# Patient Record
Sex: Male | Born: 1990 | Race: White | Hispanic: No | Marital: Single | Smoking: Never smoker
Health system: Southern US, Community
[De-identification: ages and names within clinical notes are randomized; demographics above are authoritative.]

## PROBLEM LIST (undated history)

## (undated) DIAGNOSIS — F32A Depression, unspecified: Secondary | ICD-10-CM

## (undated) DIAGNOSIS — F988 Other specified behavioral and emotional disorders with onset usually occurring in childhood and adolescence: Secondary | ICD-10-CM

## (undated) DIAGNOSIS — F329 Major depressive disorder, single episode, unspecified: Secondary | ICD-10-CM

## (undated) HISTORY — DX: Other specified behavioral and emotional disorders with onset usually occurring in childhood and adolescence: F98.8

## (undated) HISTORY — DX: Depression, unspecified: F32.A

---
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
## 2012-12-31 ENCOUNTER — Emergency Department (INDEPENDENT_AMBULATORY_CARE_PROVIDER_SITE_OTHER)
Admission: EM | Admit: 2012-12-31 | Discharge: 2012-12-31 | Disposition: A | Discharge status: Home / Self Care | Payer: BC Managed Care – PPO | Source: Home / Self Care | Attending: Emergency Medicine | Admitting: Emergency Medicine

## 2012-12-31 ENCOUNTER — Encounter (HOSPITAL_COMMUNITY): Payer: Self-pay | Admitting: *Deleted

## 2012-12-31 ENCOUNTER — Emergency Department (INDEPENDENT_AMBULATORY_CARE_PROVIDER_SITE_OTHER): Payer: BC Managed Care – PPO

## 2012-12-31 DIAGNOSIS — J208 Acute bronchitis due to other specified organisms: Secondary | ICD-10-CM

## 2012-12-31 DIAGNOSIS — J209 Acute bronchitis, unspecified: Secondary | ICD-10-CM

## 2012-12-31 MED ORDER — TRAMADOL HCL 50 MG PO TABS: 100.0000 mg | ORAL_TABLET | Freq: Three times a day (TID) | ORAL | Status: DC | PRN

## 2012-12-31 MED ORDER — IPRATROPIUM BROMIDE 0.02 % IN SOLN
0.5000 mg | Freq: Once | RESPIRATORY_TRACT | Status: AC
  Administered 2012-12-31: 0.5 mg via RESPIRATORY_TRACT

## 2012-12-31 MED ORDER — PREDNISONE 20 MG PO TABS: 20.0000 mg | ORAL_TABLET | Freq: Two times a day (BID) | ORAL | Status: DC

## 2012-12-31 MED ORDER — ALBUTEROL SULFATE HFA 108 (90 BASE) MCG/ACT IN AERS: 2.0000 | INHALATION_SPRAY | Freq: Four times a day (QID) | RESPIRATORY_TRACT | Status: DC

## 2012-12-31 MED ORDER — ALBUTEROL SULFATE (5 MG/ML) 0.5% IN NEBU
INHALATION_SOLUTION | RESPIRATORY_TRACT | Status: AC
  Filled 2012-12-31: qty 1

## 2012-12-31 MED ORDER — ALBUTEROL SULFATE (5 MG/ML) 0.5% IN NEBU
5.0000 mg | INHALATION_SOLUTION | Freq: Once | RESPIRATORY_TRACT | Status: AC
  Administered 2012-12-31: 5 mg via RESPIRATORY_TRACT

## 2012-12-31 MED ORDER — HYDROCOD POLST-CHLORPHEN POLST 10-8 MG/5ML PO LQCR: 5.0000 mL | Freq: Two times a day (BID) | ORAL | Status: DC | PRN

## 2012-12-31 NOTE — ED Provider Notes (Signed)
Chief Complaint:   Chief Complaint  Patient presents with  . URI    History of Present Illness:   Guthrie Lemme is a male the a history throat, chest pain and pressure, cough productive yellow-green sputum, chest tightness, wheezing, headache, nasal congestion, rhinorrhea, ear congestion, abdominal pain. The patient has a history of asthma. He states that this went away about 10 years ago and is not need any treatment since that. He denies any fever, chills, nausea, vomiting, or diarrhea. He has had no known sick exposures.  Review of Systems:  Other than noted above, the patient denies any of the following symptoms: Systemic:  No fevers, chills, sweats, weight loss or gain, fatigue, or tiredness. Eye:  No redness or discharge. ENT:  No ear pain, drainage, headache, nasal congestion, drainage, sinus pressure, difficulty swallowing, or sore throat. Neck:  No neck pain or swollen glands. Lungs:  No cough, sputum production, hemoptysis, wheezing, chest tightness, shortness of breath or chest pain. GI:  No abdominal pain, nausea, vomiting or diarrhea.  PMFSH:  Past medical history, family history, social history, meds, and allergies were reviewed.   Physical Exam:   Vital signs:  BP 129/55  Pulse 80  Temp(Src) 98.5 F (36.9 C) (Oral)  Resp 20  SpO2 99% General:  Alert and oriented.  In no distress.  Skin warm and dry. Eye:  No conjunctival injection or drainage. Lids were normal. ENT:  TMs and canals were normal, without erythema or inflammation.  Nasal mucosa was clear and uncongested, without drainage.  Mucous membranes were moist.  Pharynx was clear with no exudate or drainage.  There were no oral ulcerations or lesions. Neck:  Supple, no adenopathy, tenderness or mass. Lungs:  No respiratory distress.  He has bilateral expiratory wheezes, no rales or rhonchi.  Heart:  Regular rhythm, without gallops, murmers or rubs. Skin:  Clear, warm, and dry, without rash or lesions.  Labs:    Results for orders placed during the hospital encounter of 12/31/12  POCT RAPID STREP A ([facility] ONLY)      Result Value Range   Streptococcus, Group A Screen (Direct) NEGATIVE  NEGATIVE     Radiology:  Dg Chest 2 View  12/31/2012   CLINICAL DATA:  Sore throat and chest pressure.  EXAM: CHEST  2 VIEW  COMPARISON:  None.  FINDINGS: The heart size and mediastinal contours are within normal limits. Both lungs are clear. The visualized skeletal structures are unremarkable.  IMPRESSION: No active cardiopulmonary disease.   Electronically Signed   By: Richarda Overlie M.D.   On: 12/31/2012 20:22    Course in Urgent Care Center:   Given a DuoNeb breathing treatment. Thereafter he felt better. Lungs sounded about 50% improved with still some widely scattered expiratory wheezes.  Assessment:  The encounter diagnosis was Viral bronchitis.  No indication for antibiotics.  Plan:   1.  Meds:  The following meds were prescribed:   Discharge Medication List as of 12/31/2012  8:30 PM    START taking these medications   Details  albuterol (PROVENTIL HFA;VENTOLIN HFA) 108 (90 BASE) MCG/ACT inhaler Inhale 2 puffs into the lungs 4 (four) times daily., Starting 12/31/2012, Until Discontinued, Normal    chlorpheniramine-HYDROcodone (TUSSIONEX) 10-8 MG/5ML LQCR Take 5 mLs by mouth every 12 (twelve) hours as needed., Starting 12/31/2012, Until Discontinued, Normal    predniSONE (DELTASONE) 20 MG tablet Take 1 tablet (20 mg total) by mouth 2 (two) times daily., Starting 12/31/2012, Until Discontinued, Normal  traMADol (ULTRAM) 50 MG tablet Take 2 tablets (100 mg total) by mouth every 8 (eight) hours as needed for pain., Starting 12/31/2012, Until Discontinued, Normal        2.  Patient Education/Counseling:  The patient was given appropriate handouts, self care instructions, and instructed in symptomatic relief.  Was instructed to rest and get extra fluids.  3.  Follow up:  The patient was told to follow up  if no better in 3 to 4 days, if becoming worse in any way, and given some red flag symptoms such as fever or worsening chest discomfort which would prompt immediate return.  Follow up here as needed.      Reuben Likes, MD 12/31/12 [phone]

## 2012-12-31 NOTE — ED Notes (Signed)
Pt  Reports  Symptoms  Of  Cough  /  Congested    Stuffy  Nose  And  Headache  With  The  Symptoms  X    1  Day    At this  Time  He  Is     Sitting upright on  The  Exam table  Speaking in  Complete  Sentanc=nces  And  Is  In no  Severe  Distress

## 2013-01-03 LAB — CULTURE, GROUP A STREP

## 2013-01-04 NOTE — ED Notes (Signed)
Strep report negative for group A and negative for beta hemolytic strep; no further action required

## 2013-01-04 NOTE — ED Notes (Signed)
Lab review

## 2014-08-22 IMAGING — CR DG CHEST 2V
2 series · 2 of 2 positions shown · non-contrast
Comparison: None.

CLINICAL DATA: Sore throat and chest pressure.

EXAM:
CHEST  2 VIEW

[view not recorded (1 of 2)]
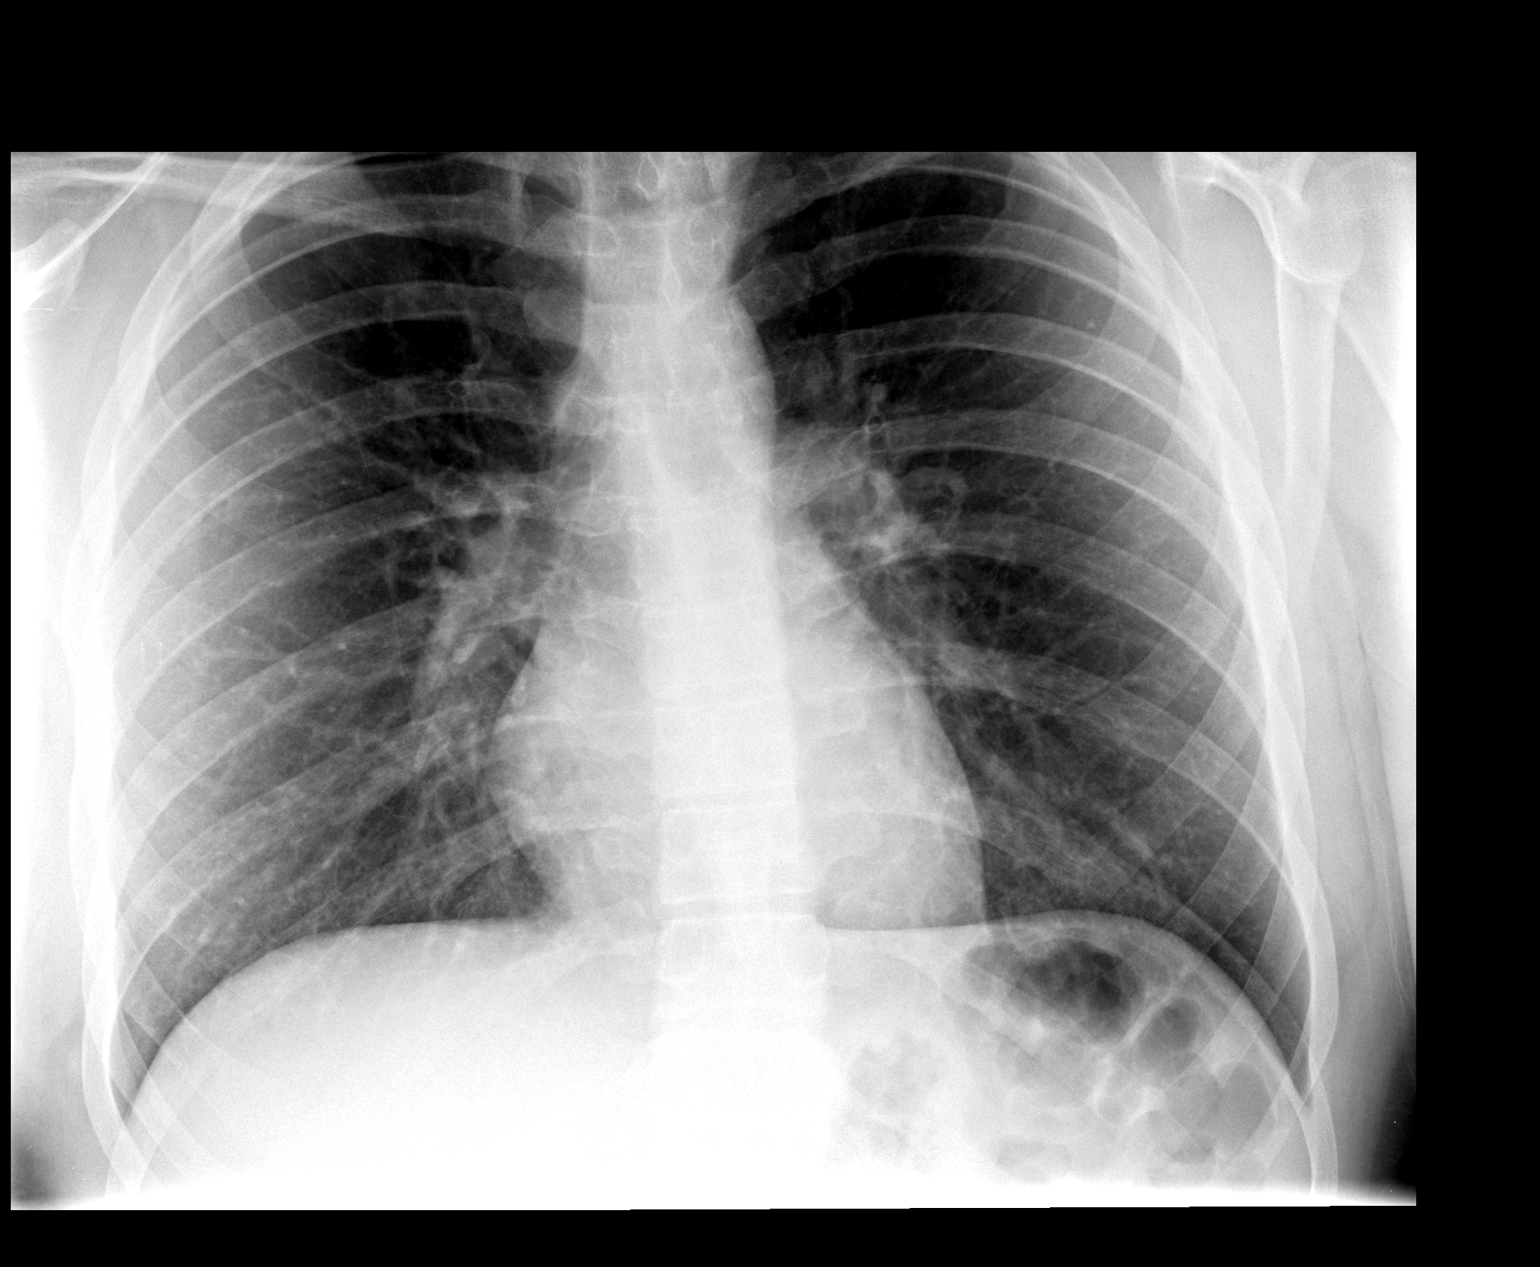

[view not recorded (2 of 2)]
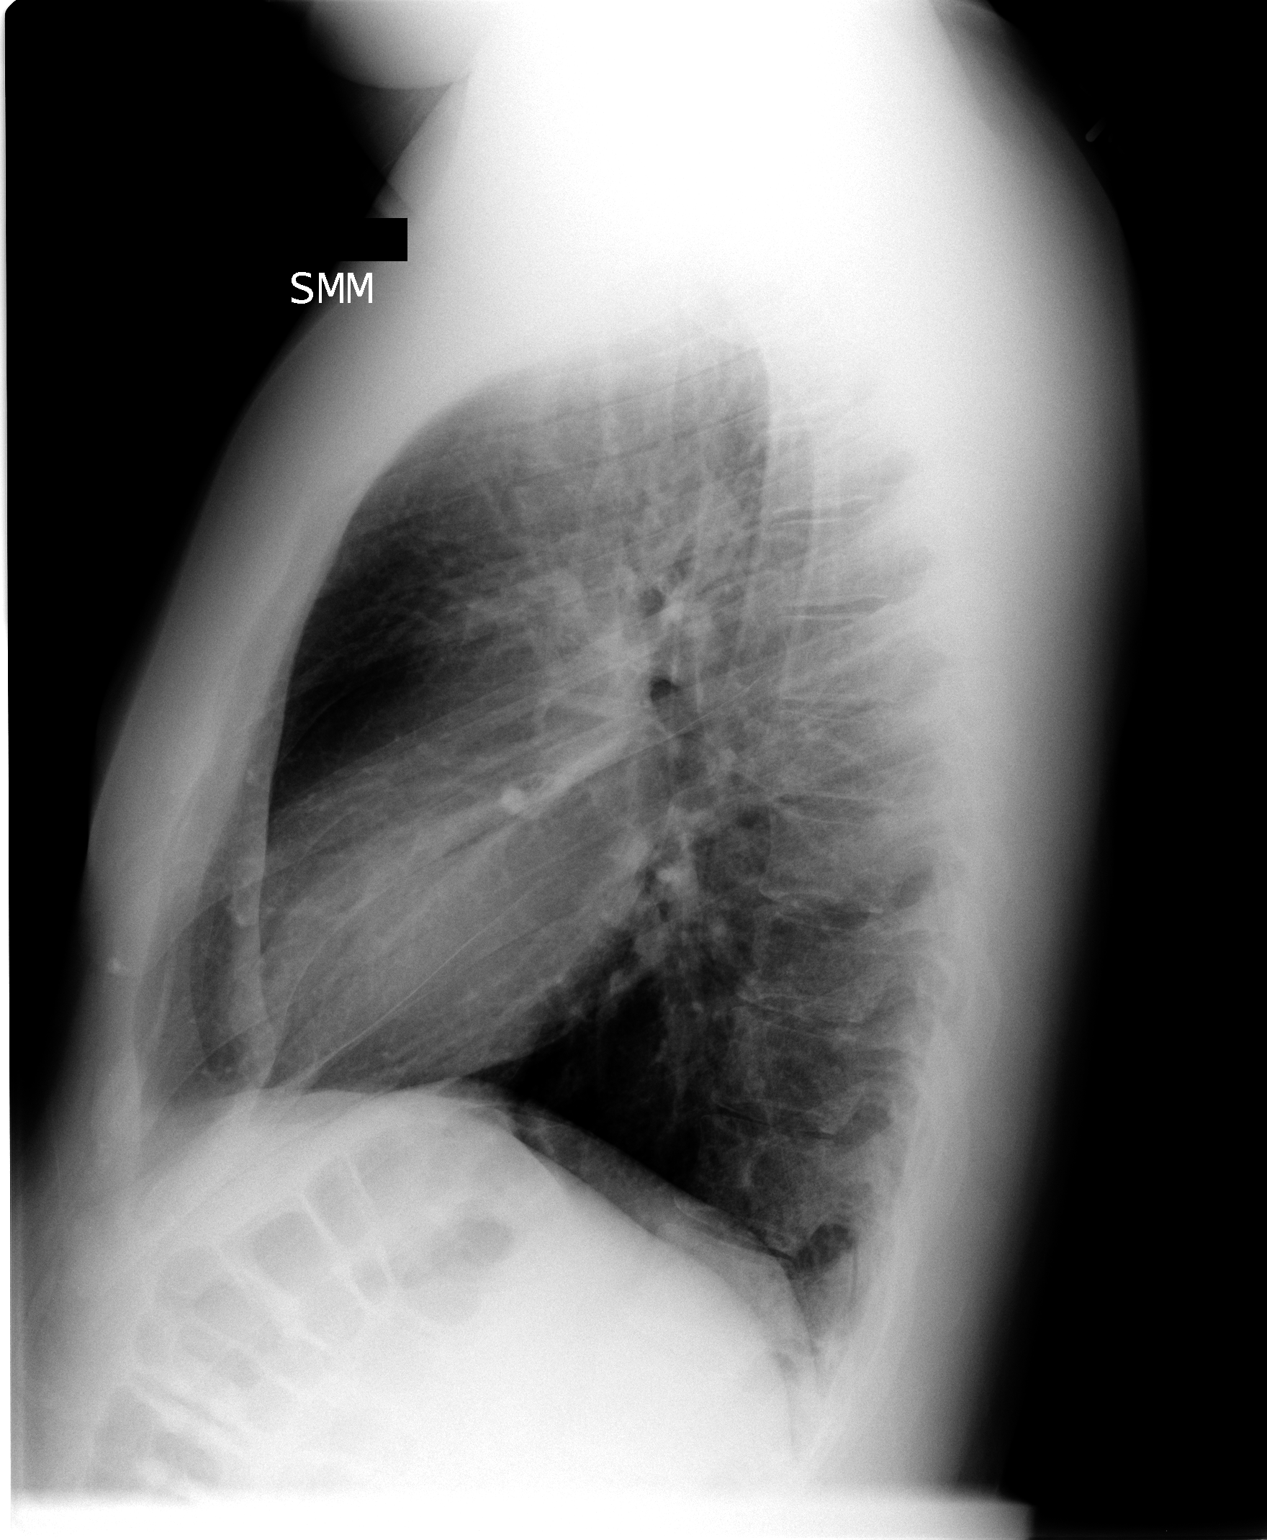

[2 of 2 positions shown; findings below may reference images not displayed]

FINDINGS: The heart size and mediastinal contours are within normal limits.
Both lungs are clear. The visualized skeletal structures are
unremarkable.
IMPRESSION: No active cardiopulmonary disease.

## 2019-03-26 ENCOUNTER — Other Ambulatory Visit: Payer: Self-pay

## 2019-03-26 DIAGNOSIS — Z20822 Contact with and (suspected) exposure to covid-19: Secondary | ICD-10-CM

## 2019-03-27 LAB — NOVEL CORONAVIRUS, NAA: SARS-CoV-2, NAA: NOT DETECTED

## 2019-04-08 ENCOUNTER — Other Ambulatory Visit: Payer: Self-pay

## 2019-04-12 ENCOUNTER — Other Ambulatory Visit: Payer: Self-pay

## 2019-04-12 ENCOUNTER — Ambulatory Visit (INDEPENDENT_AMBULATORY_CARE_PROVIDER_SITE_OTHER): Payer: Managed Care, Other (non HMO) | Admitting: Family Medicine

## 2019-04-12 ENCOUNTER — Encounter: Payer: Self-pay | Admitting: Family Medicine

## 2019-04-12 VITALS — BP 118/78 | HR 75 | Temp 98.0°F | Ht 73.0 in | Wt 217.0 lb

## 2019-04-12 DIAGNOSIS — F988 Other specified behavioral and emotional disorders with onset usually occurring in childhood and adolescence: Secondary | ICD-10-CM | POA: Diagnosis not present

## 2019-04-12 DIAGNOSIS — Z114 Encounter for screening for human immunodeficiency virus [HIV]: Secondary | ICD-10-CM | POA: Diagnosis not present

## 2019-04-12 DIAGNOSIS — Z0000 Encounter for general adult medical examination without abnormal findings: Secondary | ICD-10-CM | POA: Diagnosis not present

## 2019-04-12 LAB — CBC WITH DIFFERENTIAL/PLATELET
Basophils Absolute: 0 10*3/uL (ref 0.0–0.1)
Basophils Relative: 0.4 % (ref 0.0–3.0)
Eosinophils Absolute: 0.1 10*3/uL (ref 0.0–0.7)
Eosinophils Relative: 1.6 % (ref 0.0–5.0)
HCT: 49.3 % (ref 39.0–52.0)
Hemoglobin: 16.5 g/dL (ref 13.0–17.0)
Lymphocytes Relative: 30.9 % (ref 12.0–46.0)
Lymphs Abs: 2.3 10*3/uL (ref 0.7–4.0)
MCHC: 33.4 g/dL (ref 30.0–36.0)
MCV: 86.6 fl (ref 78.0–100.0)
Monocytes Absolute: 0.7 10*3/uL (ref 0.1–1.0)
Monocytes Relative: 9.6 % (ref 3.0–12.0)
Neutro Abs: 4.2 10*3/uL (ref 1.4–7.7)
Neutrophils Relative %: 57.5 % (ref 43.0–77.0)
Platelets: 234 10*3/uL (ref 150.0–400.0)
RBC: 5.69 Mil/uL (ref 4.22–5.81)
RDW: 12.4 % (ref 11.5–15.5)
WBC: 7.3 10*3/uL (ref 4.0–10.5)

## 2019-04-12 LAB — LIPID PANEL
Cholesterol: 214 mg/dL — ABNORMAL HIGH (ref 0–200)
HDL: 66 mg/dL (ref >39.00)
NonHDL: 147.78
Total CHOL/HDL Ratio: 3
Triglycerides: 231 mg/dL — ABNORMAL HIGH (ref 0.0–149.0)
VLDL: 46.2 mg/dL — ABNORMAL HIGH (ref 0.0–40.0)

## 2019-04-12 LAB — COMPREHENSIVE METABOLIC PANEL
ALT: 26 U/L (ref 0–53)
AST: 19 U/L (ref 0–37)
Albumin: 4.4 g/dL (ref 3.5–5.2)
Alkaline Phosphatase: 61 U/L (ref 39–117)
BUN: 19 mg/dL (ref 6–23)
CO2: 30 mEq/L (ref 19–32)
Calcium: 9.8 mg/dL (ref 8.4–10.5)
Chloride: 103 mEq/L (ref 96–112)
Creatinine, Ser: 1.01 mg/dL (ref 0.40–1.50)
GFR: 87.74 mL/min (ref >60.00)
Glucose, Bld: 99 mg/dL (ref 70–99)
Potassium: 4.3 mEq/L (ref 3.5–5.1)
Sodium: 138 mEq/L (ref 135–145)
Total Bilirubin: 0.4 mg/dL (ref 0.2–1.2)
Total Protein: 6.7 g/dL (ref 6.0–8.3)

## 2019-04-12 LAB — TSH: TSH: 1.79 u[IU]/mL (ref 0.35–4.50)

## 2019-04-12 LAB — LDL CHOLESTEROL, DIRECT: Direct LDL: 112 mg/dL

## 2019-04-12 NOTE — Patient Instructions (Signed)
For your ADD -see what you need for your job and let me know if you need a letter.  -see if you can get your records for when you were diagnosed.  -once we have work cleared, we can start you on immediate release adderall on an as needed basis. Will start at 10mg .  -follow you on this every 6 months unless you are needing frequently.   Routine labs today. Continue working out and [employer].   So nice to meet you and happy new year! DR. Rogers Blocker

## 2019-04-12 NOTE — Progress Notes (Signed)
Patient: Don Howell MRN: [id number] DOB: 1990/07/20 PCP: Patient, No Pcp Per     Subjective:  Chief Complaint  Patient presents with  . Establish Care  . Annual Exam    HPI: The patient is a 28 y.o. male who presents today for annual exam. He denies any changes to past medical history. There have been no recent hospitalizations. They are following a well balanced diet and exercise plan. Weight has been stable. He is sexually active with one partner.   No colon or breast cancer in first degree relative. Hx of obesity in his parents. Unsure of other history.   History of ADD: officially tested when he was 28 years of age. Was on medication on and off during college. Currently not on any medication. Was on adderall XR 14m at that time. He thinks it has been 3-4 years since that time. He feels like he has his symptoms controlled for the most part. He does have times that are stressful that he thinks a short acting/prn medication might work well for him.   Remote history of depression when he was 28years of age. Has not struggled with any depression since that time. On no medication. Was suicidal at that time. No hospital admits. Was put on medication at that time and took them for a few years. Denies any depression at this time. No si/hi/ah/vh for over a decade.   Immunization History  Administered Date(s) Administered  . IPV 10/12/2009, 11/09/2009  . Influenza,inj,Quad PF,6+ Mos 01/09/2019  . MMR 10/12/2009, 11/09/2009  . Td 11/27/2009, 06/04/2010  . Tdap 10/12/2009   utd on flu shot.  declines std testing.    Review of Systems  Constitutional: Negative for chills, fatigue and fever.  HENT: Negative for dental problem, ear pain, hearing loss and trouble swallowing.   Eyes: Negative for visual disturbance.  Respiratory: Negative for cough, chest tightness and shortness of breath.   Cardiovascular: Negative for chest pain, palpitations and leg swelling.  Gastrointestinal:  Negative for abdominal pain, blood in stool, diarrhea and nausea.  Endocrine: Negative for cold intolerance, heat intolerance, polydipsia, polyphagia and polyuria.  Genitourinary: Negative for dysuria, frequency, hematuria and urgency.  Musculoskeletal: Negative for arthralgias.  Skin: Negative for rash.  Neurological: Negative for dizziness and headaches.  Psychiatric/Behavioral: Positive for sleep disturbance. Negative for dysphoric mood. The patient is not nervous/anxious.     Allergies Patient has No Known Allergies.  Past Medical History Patient  has a past medical history of ADD (attention deficit disorder) and Depression.  Surgical History Patient  has no past surgical history on file.  Family History Pateint's family history is not on file.  Social History Patient  reports that he has never smoked. He has never used smokeless tobacco. He reports current alcohol use. He reports that he does not use drugs.    Objective: Vitals:   04/12/19 1048  BP: 118/78  Pulse: 75  Temp: 98 F (36.7 C)  TempSrc: Skin  SpO2: 95%  Weight: 98.4 kg  Height: '6\' 1"'  (1.854 m)    Body mass index is 28.63 kg/m.  Physical Exam Vitals reviewed.  Constitutional:      Appearance: Normal appearance. He is well-developed and normal weight.  HENT:     Head: Normocephalic and atraumatic.     Right Ear: Tympanic membrane, ear canal and external ear normal.     Left Ear: Tympanic membrane, ear canal and external ear normal.     Nose: Nose normal.  Mouth/Throat:     Mouth: Mucous membranes are moist.  Eyes:     Extraocular Movements: Extraocular movements intact.     Conjunctiva/sclera: Conjunctivae normal.     Pupils: Pupils are equal, round, and reactive to light.  Neck:     Thyroid: No thyromegaly.  Cardiovascular:     Rate and Rhythm: Normal rate and regular rhythm.     Pulses: Normal pulses.     Heart sounds: Normal heart sounds. No murmur.  Pulmonary:     Effort: Pulmonary  effort is normal.     Breath sounds: Normal breath sounds.  Abdominal:     General: Abdomen is flat. Bowel sounds are normal. There is no distension.     Palpations: Abdomen is soft.     Tenderness: There is no abdominal tenderness.  Musculoskeletal:     Cervical back: Normal range of motion and neck supple.  Lymphadenopathy:     Cervical: No cervical adenopathy.  Skin:    General: Skin is warm and dry.     Findings: No rash.  Neurological:     General: No focal deficit present.     Mental Status: He is alert and oriented to person, place, and time.     Cranial Nerves: No cranial nerve deficit.     Coordination: Coordination normal.     Deep Tendon Reflexes: Reflexes normal.  Psychiatric:        Mood and Affect: Mood normal.        Behavior: Behavior normal.     Comments: No si/hi/ah/vh         Depression screen PHQ 2/9 04/12/2019  Decreased Interest 1  Down, Depressed, Hopeless 0  PHQ - 2 Score 1  Altered sleeping 1  Tired, decreased energy 0  Change in appetite 0  Feeling bad or failure about yourself  0  Trouble concentrating 1  Moving slowly or fidgety/restless 0  Suicidal thoughts 0  PHQ-9 Score 3  Difficult doing work/chores Not difficult at all    Assessment/plan: 1. Annual physical exam Routine lab work today. He is not fasting. utd on HM. HIV screen today. Declines STD testing. Encouraged regular exercise/healthy diet. F/u in one year or as needed.  Patient counseling '[x]'    Nutrition: Stressed importance of moderation in sodium/caffeine intake, saturated fat and cholesterol, caloric balance, sufficient intake of fresh fruits, vegetables, fiber, calcium, iron, and 1 mg of folate supplement per day (for females capable of pregnancy).  '[x]'    Stressed the importance of regular exercise.   '[]'    Substance Abuse: Discussed cessation/primary prevention of tobacco, alcohol, or other drug use; driving or other dangerous activities under the influence; availability of  treatment for abuse.   '[x]'    Injury prevention: Discussed safety belts, safety helmets, smoke detector, smoking near bedding or upholstery.   '[x]'    Sexuality: Discussed sexually transmitted diseases, partner selection, use of condoms, avoidance of unintended pregnancy  and contraceptive alternatives.  '[x]'    Dental health: Discussed importance of regular tooth brushing, flossing, and dental visits.  '[x]'    Health maintenance and immunizations reviewed. Please refer to Health maintenance section.  . - CBC with Differential/Platelet - Comprehensive metabolic panel - Lipid panel - TSH  2. ADD (attention deficit disorder) without hyperactivity Has not taken medication in years. I have in his chart from 2014. Would prefer he find his records and he will look for this. Does not want daily medication as he feels like he manages okay, but more interested in as  needed IR adderrall. im on board with this. Will do baseline UDS and then he is going to see if he needs a letter with his job before we start this. Wills tart him on 48m IR prn. Will write letter for job if needed. Will have him f/u q 670monthsince does not take that often. - Pain Mgmt, Profile 8 w/Conf, U  3. Encounter for screening for HIV  - HIV antibody   This visit occurred during the SARS-CoV-2 public health emergency.  Safety protocols were in place, including screening questions prior to the visit, additional usage of staff PPE, and extensive cleaning of exam room while observing appropriate contact time as indicated for disinfecting solutions.      Return in about 6 months (around 10/11/2019) for add.     AlOrma FlamingMD [place]12/28/2020

## 2019-04-13 LAB — PAIN MGMT, PROFILE 8 W/CONF, U
6 Acetylmorphine: NEGATIVE ng/mL
Alcohol Metabolites: NEGATIVE ng/mL (ref <500)
Amphetamines: NEGATIVE ng/mL
Benzodiazepines: NEGATIVE ng/mL
Buprenorphine, Urine: NEGATIVE ng/mL
Cocaine Metabolite: NEGATIVE ng/mL
Creatinine: 172.6 mg/dL
MDMA: NEGATIVE ng/mL
Marijuana Metabolite: NEGATIVE ng/mL
Opiates: NEGATIVE ng/mL
Oxidant: NEGATIVE ug/mL
Oxycodone: NEGATIVE ng/mL
pH: 6.8 (ref 4.5–9.0)

## 2019-04-13 LAB — HIV ANTIBODY (ROUTINE TESTING W REFLEX): HIV 1&2 Ab, 4th Generation: NONREACTIVE

## 2019-07-15 ENCOUNTER — Ambulatory Visit (INDEPENDENT_AMBULATORY_CARE_PROVIDER_SITE_OTHER): Payer: Managed Care, Other (non HMO) | Admitting: Family Medicine

## 2019-07-15 ENCOUNTER — Encounter: Payer: Self-pay | Admitting: Family Medicine

## 2019-07-15 ENCOUNTER — Other Ambulatory Visit: Payer: Self-pay

## 2019-07-15 VITALS — BP 128/80 | HR 70 | Temp 97.8°F | Ht 73.0 in | Wt 226.2 lb

## 2019-07-15 DIAGNOSIS — Z3009 Encounter for other general counseling and advice on contraception: Secondary | ICD-10-CM | POA: Diagnosis not present

## 2019-07-15 NOTE — Patient Instructions (Signed)
Vasectomy Vasectomy is a procedure in which the tube that carries sperm from the testicle to the urethra (vas deferens) is tied. It may also be cut. The procedure blocks sperm from going through the vas deferens and penis during ejaculation. This ensures that sperm does not go into the vagina during sex. Vasectomy does not affect your sexual desire or performance, and does not prevent sexually transmitted diseases. Vasectomy is considered a permanent and very effective form of birth control (contraception). The decision to have a vasectomy should not be made during a stressful situation, such as after the loss of a pregnancy or a divorce. You and your partner should make the decision to have a vasectomy when you are sure that you do not want children in the future. Tell a health care provider about:  Any allergies you have.  All medicines you are taking, including vitamins, herbs, eye drops, creams, and over-the-counter medicines.  Any problems you or family members have had with anesthetic medicines.  Any blood disorders you have.  Any surgeries you have had.  Any medical conditions you have. What are the risks? Generally, this is a safe procedure. However, problems may occur, including:  Infection.  Bleeding and swelling of the scrotum.  Allergic reactions to medicines.  Failure of the procedure to prevent pregnancy. There is a very small chance that the cut ends of the vas deferens may reconnect (recanalization), meaning that you could still make a woman pregnant.  Pain in the scrotum that continues after healing from the procedure. What happens before the procedure?  Ask your health care provider about: ? Changing or stopping your regular medicines. This is especially important if you are taking diabetes medicines or blood thinners. ? Taking over-the-counter medicines, vitamins, herbs, and supplements. ? Taking medicines such as aspirin and ibuprofen. These medicines can thin  your blood. Do not take these medicines unless your health care provider tells you to take them.  You may be asked to shower with a germ-killing soap.  Plan to have someone take you home from the hospital or clinic. What happens during the procedure?   To lower your risk of infection: ? Your health care team will wash or sanitize their hands. ? Hair may be removed from the surgical area. ? Your scrotum will be washed with soap.  You will be given one or more of the following: ? A medicine to help you relax (sedative). You may be instructed to take this a few hours before the procedure. ? A medicine to numb the area (local anesthetic).  Your health care provider will feel (palpate) for your vas deferens.  To reach the vas deferens, one of two methods may be used: ? A very small incision may be made in your scrotum. ? A punctured opening may be made in your scrotum, without an incision.  Your vas deferens will be pulled out of your scrotum, and may be: ? Tied off. ? Cut and possibly burned (cauterized) at the ends to seal them off.  The vas deferens will be put back into your scrotum.  The incision or puncture opening will be closed with absorbable stitches (sutures). The sutures will eventually dissolve and will not need to be removed after the procedure. The procedure may vary among health care providers and hospitals. What happens after the procedure?  You will be monitored to make sure that you do not experience problems.  You will be asked not to ejaculate for at least 1 week after   the procedure, or as long as directed.  You will need to use a different form of contraception for 2-4 months after the procedure, until you have test results confirming that there are no sperm in your semen.  You may be given scrotal support to wear, such as a jock strap or underwear with a supportive pouch.  Do not drive for 24 hours if you were given a sedative to help you  relax. Summary  Vasectomy is considered a permanent and very effective form of birth control (contraception). The procedure prevents sperm from being released during ejaculation.  Your scrotum will be numbed with medicine (local anesthetic) for the procedure.  After the procedure, you will be asked not to ejaculate for at least 1 week, or for as long as directed. You will also need to use a different form of contraception until your health care provider examines you and finds that there are no sperm in your semen. This information is not intended to replace advice given to you by your health care provider. Make sure you discuss any questions you have with your health care provider. Document Revised: 10/03/2017 Document Reviewed: 06/28/2016 Elsevier Patient Education  2020 [employer]  

## 2019-07-15 NOTE — Progress Notes (Signed)
Patient: Don Howell MRN: [id number] DOB: [birth year] PCP: [name], MD     Subjective:  Chief Complaint  Patient presents with  . Sterilization    Discuss options    HPI: The patient is a 29 y.o. male who presents today for Evaluation for Vasectomy. He states being with his partner for 8 years now, and have decided they do not want children.  His wife is on board. She has an IUD and is wanting to take this out. His wife does not want any children either. He is wanting a referral. No surgeries to his testicles, trauma or issues that he is aware of that would make this complicated.    Review of Systems  Constitutional: Negative for chills, fatigue and fever.  HENT: Negative for postnasal drip, sinus pressure and sore throat.   Cardiovascular: Negative for chest pain and palpitations.  Gastrointestinal: Negative for abdominal pain, nausea and vomiting.  Genitourinary: Negative for difficulty urinating, discharge, dysuria, genital sores, penile pain, penile swelling, scrotal swelling and testicular pain.  Neurological: Negative for dizziness, weakness and headaches.    Allergies Patient has No Known Allergies.  Past Medical History Patient  has a past medical history of ADD (attention deficit disorder) and Depression.  Surgical History Patient  has no past surgical history on file.  Family History Pateint's family history is not on file.  Social History Patient  reports that he has never smoked. He has never used smokeless tobacco. He reports current alcohol use. He reports that he does not use drugs.    Objective: Vitals:   07/15/19 0838  BP: 128/80  Pulse: 70  Temp: 97.8 F (36.6 C)  TempSrc: Temporal  SpO2: 98%  Weight: 226 lb 3.2 oz (102.6 kg)  Height: 6\' 1"  (1.854 m)    Body mass index is 29.84 kg/m.  Physical Exam Vitals reviewed.  Constitutional:      Appearance: Normal appearance. He is normal weight.  HENT:     Head: Normocephalic and  atraumatic.  Cardiovascular:     Rate and Rhythm: Normal rate and regular rhythm.     Heart sounds: Normal heart sounds.  Pulmonary:     Effort: Pulmonary effort is normal.     Breath sounds: Normal breath sounds.  Abdominal:     General: Abdomen is flat. Bowel sounds are normal.     Palpations: Abdomen is soft.  Musculoskeletal:     Cervical back: Normal range of motion and neck supple.  Neurological:     General: No focal deficit present.     Mental Status: He is alert and oriented to person, place, and time.  Psychiatric:        Mood and Affect: Mood normal.        Behavior: Behavior normal.        Assessment/plan: 1. Vasectomy evaluation Both him and his wife do not want children and desire permanent sterilization with vasectomy. Referral placed. Discussed that his wife's IUD needs to stay in place until both samples have been provided post vasectomy with no sperm.  - Ambulatory referral to Urology  This visit occurred during the SARS-CoV-2 public health emergency.  Safety protocols were in place, including screening questions prior to the visit, additional usage of staff PPE, and extensive cleaning of exam room while observing appropriate contact time as indicated for disinfecting solutions.    Return if symptoms worsen or fail to improve.   , MD [place] [facility]   07/15/2019

## 2019-09-27 ENCOUNTER — Ambulatory Visit (INDEPENDENT_AMBULATORY_CARE_PROVIDER_SITE_OTHER): Payer: 59 | Admitting: Psychology

## 2019-09-27 DIAGNOSIS — F411 Generalized anxiety disorder: Secondary | ICD-10-CM | POA: Diagnosis not present

## 2019-10-04 ENCOUNTER — Ambulatory Visit (INDEPENDENT_AMBULATORY_CARE_PROVIDER_SITE_OTHER): Payer: 59 | Admitting: Psychology

## 2019-10-04 DIAGNOSIS — F411 Generalized anxiety disorder: Secondary | ICD-10-CM | POA: Diagnosis not present

## 2019-10-11 ENCOUNTER — Ambulatory Visit: Payer: Managed Care, Other (non HMO) | Admitting: Family Medicine

## 2019-10-11 ENCOUNTER — Ambulatory Visit (INDEPENDENT_AMBULATORY_CARE_PROVIDER_SITE_OTHER): Payer: 59 | Admitting: Psychology

## 2019-10-11 DIAGNOSIS — Z0289 Encounter for other administrative examinations: Secondary | ICD-10-CM

## 2019-10-11 DIAGNOSIS — F411 Generalized anxiety disorder: Secondary | ICD-10-CM

## 2019-11-14 HISTORY — PX: VASECTOMY: SHX75

## 2019-11-30 ENCOUNTER — Ambulatory Visit: Payer: 59 | Admitting: Psychology

## 2019-12-14 ENCOUNTER — Ambulatory Visit (INDEPENDENT_AMBULATORY_CARE_PROVIDER_SITE_OTHER): Payer: 59 | Admitting: Psychology

## 2019-12-14 DIAGNOSIS — F411 Generalized anxiety disorder: Secondary | ICD-10-CM

## 2019-12-28 ENCOUNTER — Ambulatory Visit (INDEPENDENT_AMBULATORY_CARE_PROVIDER_SITE_OTHER): Payer: 59 | Admitting: Psychology

## 2019-12-28 DIAGNOSIS — F411 Generalized anxiety disorder: Secondary | ICD-10-CM | POA: Diagnosis not present

## 2020-02-10 ENCOUNTER — Ambulatory Visit (INDEPENDENT_AMBULATORY_CARE_PROVIDER_SITE_OTHER): Payer: 59 | Admitting: Psychology

## 2020-02-10 DIAGNOSIS — F411 Generalized anxiety disorder: Secondary | ICD-10-CM | POA: Diagnosis not present

## 2020-03-27 ENCOUNTER — Ambulatory Visit: Payer: 59 | Admitting: Psychology

## 2020-04-27 ENCOUNTER — Telehealth (INDEPENDENT_AMBULATORY_CARE_PROVIDER_SITE_OTHER): Payer: 59 | Admitting: Family Medicine

## 2020-04-27 ENCOUNTER — Encounter: Payer: Self-pay | Admitting: Family Medicine

## 2020-04-27 ENCOUNTER — Other Ambulatory Visit: Payer: Self-pay

## 2020-04-27 VITALS — Ht 73.0 in | Wt 102.6 lb

## 2020-04-27 DIAGNOSIS — U071 COVID-19: Secondary | ICD-10-CM | POA: Diagnosis not present

## 2020-04-27 NOTE — Progress Notes (Signed)
Patient: Don Howell MRN: [id number] DOB: Feb 06, 1991 PCP: [name], MD     I connected with [name] on 04/27/20 at 1:28pm by a video enabled telemedicine application and verified that I am speaking with the correct person using two identifiers.  Location patient: Home Location provider: [facility], Office Persons participating in this virtual visit: Zoey Gilkeson and DR. [name]   I discussed the limitations of evaluation and management by telemedicine and the availability of in person appointments. The patient expressed understanding and agreed to proceed.   Subjective:  Chief Complaint  Patient presents with  . Covid Positive    HPI: The patient is a 30 y.o. male who presents today for Positive Covid results. Mild symptoms started on Sunday. He complains of sore throat and night sweats. He states he feels much better today. He doesn't really feel sick except his sore throat. He states his symptoms were pretty mild and then the next 2 days were rough and today is much better. He felt like his throat was on fire and it was non stop. Today it is much better, just sore. He has gargled with warm salt water just once and is doing the mouth wash. He has no fever or chills. He has not taken any fever medication today either. He has no shortness of breath or wheezing. He has had a rare cough. He has no chest pain. He has some nasal congestion. He doesn't have a headache anymore. Mild sinus pain and pressure. He is doing much better. Not really fatigued and no longer achy.   He has been vaccinated and boosted back in October 2021.   Review of Systems  Constitutional: Negative for chills, fatigue and fever.  HENT: Positive for congestion and sore throat. Negative for sinus pressure and sinus pain.   Respiratory: Positive for cough. Negative for chest tightness and shortness of breath.   Gastrointestinal: Negative for abdominal pain, diarrhea, nausea and vomiting.   Neurological: Negative for dizziness and headaches.    Allergies Patient has No Known Allergies.  Past Medical History Patient  has a past medical history of ADD (attention deficit disorder) and Depression.  Surgical History Patient  has a past surgical history that includes Vasectomy (11/2019).  Family History Pateint's family history is not on file.  Social History Patient  reports that he has never smoked. He has never used smokeless tobacco. He reports current alcohol use. He reports that he does not use drugs.    Objective: Vitals:   04/27/20 1112  Weight: 102 lb 9.6 oz (46.5 kg)  Height: 6\' 1"  (1.854 m)    Body mass index is 13.54 kg/m.  Physical Exam Vitals reviewed.  Constitutional:      Appearance: Normal appearance. He is normal weight.  HENT:     Head: Normocephalic and atraumatic.     Nose: Congestion present.  Pulmonary:     Effort: Pulmonary effort is normal.  Skin:    Capillary Refill: Capillary refill takes less than 2 seconds.  Neurological:     General: No focal deficit present.     Mental Status: He is alert and oriented to person, place, and time.  Psychiatric:        Mood and Affect: Mood normal.        Behavior: Behavior normal.        Assessment/plan: 1. COVID-19 -mild illness and improving. Doing well today. No lower respiratory issues.  -starting him on treatment nutraceutical bundle including zinc sulfate, vit D, vit  C, quercetin and melatonin 5-15+ days depending on symptoms. HE has already started this.  -gargle mouthwash TID -outside as much as possible.  -pulse ox >93-94% -precautions given. They are to get in touch with me if worsening symptoms.  -quarantine x 10 days.     Return if symptoms worsen or fail to improve.    [name], MD [facility]  04/27/2020

## 2020-04-29 ENCOUNTER — Encounter: Payer: Self-pay | Admitting: Family Medicine

## 2020-05-01 NOTE — Telephone Encounter (Signed)
Dr. [name], pt would like to be out of work longer. See message and advise if okay to give new note?

## 2020-06-01 ENCOUNTER — Ambulatory Visit (INDEPENDENT_AMBULATORY_CARE_PROVIDER_SITE_OTHER): Payer: 59 | Admitting: Psychology

## 2020-06-01 DIAGNOSIS — F411 Generalized anxiety disorder: Secondary | ICD-10-CM

## 2020-06-05 ENCOUNTER — Encounter: Payer: Self-pay | Admitting: Family Medicine

## 2020-06-05 ENCOUNTER — Telehealth (INDEPENDENT_AMBULATORY_CARE_PROVIDER_SITE_OTHER): Payer: 59 | Admitting: Family Medicine

## 2020-06-05 VITALS — Ht 73.0 in | Wt 220.0 lb

## 2020-06-05 DIAGNOSIS — F411 Generalized anxiety disorder: Secondary | ICD-10-CM | POA: Diagnosis not present

## 2020-06-05 MED ORDER — HYDROXYZINE HCL 25 MG PO TABS: 25.0000 mg | ORAL_TABLET | Freq: Three times a day (TID) | ORAL | 0 refills | Status: DC | PRN

## 2020-06-05 NOTE — Progress Notes (Signed)
Patient: Don Howell MRN: [id number] DOB: 06-11-1990 PCP: [name], MD     I connected with Don Howell on 06/05/20 at 2:25pm by a video enabled telemedicine application and verified that I am speaking with the correct person using two identifiers.  Location patient: Home Location provider: [facility], Office Persons participating in this virtual visit: [name] and Dr. [name]   I discussed the limitations of evaluation and management by telemedicine and the availability of in person appointments. The patient expressed understanding and agreed to proceed.   Subjective:  Chief Complaint  Patient presents with  . Anxiety    HPI: The patient is a 30 y.o. male who presents today for Anxiety. He states he has started to have anxiety that seems to be worse when he has been alone or at night time. He will replay interactions with people through his head. He states he has always had anxiety, he doesn't think he was officially diagnosed with anxiety, but did have depression and ADHD. He was put on medication for depression when he was 30 years of age. He thinks it was zoloft and it helped with his depression. He weaned off of this and he hasn't had any mood issues or depression since that time. He states his anxiety typically has presented this way. He states it has to do with meeting someone new, an interaction that he had that didn't go the way he felt it should. He states his anxiety can affect him daily, especially at night but is not consistent and he can go days without any issues.  Anxiety is not consistent, it is not a daily thing. It can be 1x/week or 4-5x/week.  He is also currently in counseling. He gets a lot of physical activity at work and goes on a walk daily. He is more interested in prn medication. He has had mild panic attacks "once in a while." does not happen often. Denies any si/hi/ah/vh.   Review of Systems  Constitutional: Negative for chills, fatigue and  fever.  HENT: Negative for dental problem, ear pain, hearing loss and trouble swallowing.   Eyes: Negative for visual disturbance.  Respiratory: Negative for cough, chest tightness and shortness of breath.   Cardiovascular: Negative for chest pain, palpitations and leg swelling.  Gastrointestinal: Negative for abdominal pain, blood in stool, diarrhea and nausea.  Endocrine: Negative for cold intolerance, polydipsia, polyphagia and polyuria.  Genitourinary: Negative for dysuria and hematuria.  Musculoskeletal: Negative for arthralgias.  Skin: Negative for rash.  Neurological: Negative for dizziness and headaches.  Psychiatric/Behavioral: Negative for dysphoric mood and sleep disturbance. The patient is not nervous/anxious.     Allergies Patient has No Known Allergies.  Past Medical History Patient  has a past medical history of ADD (attention deficit disorder) and Depression.  Surgical History Patient  has a past surgical history that includes Vasectomy (11/2019).  Family History Pateint's family history is not on file.  Social History Patient  reports that he has never smoked. He has never used smokeless tobacco. He reports current alcohol use. He reports that he does not use drugs.    Objective: Vitals:   06/05/20 1355  Weight: 220 lb (99.8 kg)  Height: 6\' 1"  (1.854 m)    Body mass index is 29.03 kg/m.  Physical Exam Vitals reviewed.  Constitutional:      General: He is not in acute distress.    Appearance: Normal appearance. He is normal weight. He is not ill-appearing.  HENT:  Head: Normocephalic and atraumatic.  Pulmonary:     Effort: Pulmonary effort is normal.  Neurological:     General: No focal deficit present.     Mental Status: He is alert and oriented to person, place, and time.  Psychiatric:        Mood and Affect: Mood normal.        Behavior: Behavior normal.     Comments: No si/hi/         GAD 7 : Generalized Anxiety Score 06/05/2020   Nervous, Anxious, on Edge 1  Control/stop worrying 1  Worry too much - different things 1  Trouble relaxing 1  Restless 0  Easily annoyed or irritable 0  Afraid - awful might happen 2  Total GAD 7 Score 6  Anxiety Difficulty Not difficult at all     Assessment/plan: 1. GAD (generalized anxiety disorder) His GAD7 score is quite mild and he states it's not daily. Discussed options. He is already in counseling and doing physical activity. He rarely has a panic attack. No affecting him daily. Discussed PRN option medication. Will do trial of hydroxyzine prn. Does not have anxiety on his job, only at night when alone so discussed this may make him drowsy. Continue counseling. Will f/u with me in 3 months if needed or sooner. He is to let me know if medication is not helpful as we discussed propranolol or even buspar could be alternative prn treatment.      Return in about 3 months (around 09/02/2020) for anxiety .   [name], MD [name]  06/05/2020

## 2020-06-15 ENCOUNTER — Ambulatory Visit (INDEPENDENT_AMBULATORY_CARE_PROVIDER_SITE_OTHER): Payer: 59 | Admitting: Psychology

## 2020-06-15 DIAGNOSIS — F411 Generalized anxiety disorder: Secondary | ICD-10-CM | POA: Diagnosis not present

## 2020-06-29 ENCOUNTER — Ambulatory Visit (INDEPENDENT_AMBULATORY_CARE_PROVIDER_SITE_OTHER): Payer: 59 | Admitting: Psychology

## 2020-06-29 DIAGNOSIS — F411 Generalized anxiety disorder: Secondary | ICD-10-CM | POA: Diagnosis not present

## 2020-07-13 ENCOUNTER — Ambulatory Visit: Payer: 59 | Admitting: Psychology

## 2020-07-27 ENCOUNTER — Ambulatory Visit (INDEPENDENT_AMBULATORY_CARE_PROVIDER_SITE_OTHER): Payer: 59 | Admitting: Psychology

## 2020-07-27 DIAGNOSIS — F411 Generalized anxiety disorder: Secondary | ICD-10-CM | POA: Diagnosis not present

## 2020-08-10 ENCOUNTER — Ambulatory Visit: Payer: 59 | Admitting: Psychology

## 2020-08-24 ENCOUNTER — Ambulatory Visit (INDEPENDENT_AMBULATORY_CARE_PROVIDER_SITE_OTHER): Payer: 59 | Admitting: Psychology

## 2020-08-24 DIAGNOSIS — F411 Generalized anxiety disorder: Secondary | ICD-10-CM | POA: Diagnosis not present

## 2020-09-07 ENCOUNTER — Ambulatory Visit (INDEPENDENT_AMBULATORY_CARE_PROVIDER_SITE_OTHER): Payer: 59 | Admitting: Psychology

## 2020-09-07 DIAGNOSIS — F411 Generalized anxiety disorder: Secondary | ICD-10-CM

## 2020-09-21 ENCOUNTER — Ambulatory Visit: Payer: 59 | Admitting: Psychology

## 2020-10-05 ENCOUNTER — Ambulatory Visit: Payer: 59 | Admitting: Psychology

## 2020-10-19 ENCOUNTER — Ambulatory Visit: Payer: 59 | Admitting: Psychology

## 2021-01-11 ENCOUNTER — Telehealth: Payer: 59 | Admitting: Nurse Practitioner

## 2021-01-11 DIAGNOSIS — J069 Acute upper respiratory infection, unspecified: Secondary | ICD-10-CM

## 2021-01-11 MED ORDER — FLUTICASONE PROPIONATE 50 MCG/ACT NA SUSP: 2.0000 | Freq: Every day | NASAL | 6 refills | Status: DC

## 2021-01-11 NOTE — Progress Notes (Signed)
E-Visit for Sinus Problems  We are sorry that you are not feeling well.  Here is how we plan to help!  Based on what you have shared with me it looks like you have sinusitis.  Sinusitis is inflammation and infection in the sinus cavities of the head.  Based on your presentation I believe you most likely have Acute Viral Sinusitis.This is an infection most likely caused by a virus. There is not specific treatment for viral sinusitis other than to help you with the symptoms until the infection runs its course.  You may use an oral decongestant such as Mucinex D or if you have glaucoma or high blood pressure use plain Mucinex. Saline nasal spray help and can safely be used as often as needed for congestion, I have prescribed: Fluticasone nasal spray two sprays in each nostril once a day  Some authorities believe that zinc sprays or the use of Echinacea may shorten the course of your symptoms.  Sinus infections are not as easily transmitted as other respiratory infection, however we still recommend that you avoid close contact with loved ones, especially the very young and elderly.  Remember to wash your hands thoroughly throughout the day as this is the number one way to prevent the spread of infection!  Home Care: Only take medications as instructed by your medical team. Do not take these medications with alcohol. A steam or ultrasonic humidifier can help congestion.  You can place a towel over your head and breathe in the steam from hot water coming from a faucet. Avoid close contacts especially the very young and the elderly. Cover your mouth when you cough or sneeze. Always remember to wash your hands.  Get Help Right Away If: You develop worsening fever or sinus pain. You develop a severe head ache or visual changes. Your symptoms persist after you have completed your treatment plan.  Make sure you Understand these instructions. Will watch your condition. Will get help right away if you  are not doing well or get worse.   Thank you for choosing an e-visit.  Your e-visit answers were reviewed by a board certified advanced clinical practitioner to complete your personal care plan. Depending upon the condition, your plan could have included both over the counter or prescription medications.  Please review your pharmacy choice. Make sure the pharmacy is open so you can pick up prescription now. If there is a problem, you may contact your provider through [employer] and have the prescription routed to another pharmacy.  Your safety is important to [place]. If you have drug allergies check your prescription carefully.   For the next 24 hours you can use MyChart to ask questions about today's visit, request a non-urgent call back, or ask for a work or school excuse. You will get an email in the next two days asking about your experience. I hope that your e-visit has been valuable and will speed your recovery.  I spent approximately 7 minutes reviewing the patient's history, current symptoms and coordinating their plan of care today.

## 2021-01-22 ENCOUNTER — Encounter: Payer: 59 | Admitting: Family Medicine

## 2021-01-23 ENCOUNTER — Encounter: Payer: Self-pay | Admitting: Family Medicine

## 2021-03-23 ENCOUNTER — Telehealth: Payer: 59 | Admitting: Physician Assistant

## 2021-03-23 DIAGNOSIS — R6889 Other general symptoms and signs: Secondary | ICD-10-CM | POA: Diagnosis not present

## 2021-03-23 NOTE — Progress Notes (Signed)
E visit for Flu like symptoms   We are sorry that you are not feeling well.  Here is how we plan to help! Based on what you have shared with me it looks like you may have flu-like symptoms that should be watched but do not seem to indicate anti-viral treatment.  Influenza or "the flu" is   an infection caused by a respiratory virus. The flu virus is highly contagious and persons who did not receive their yearly flu vaccination may "catch" the flu from close contact.  We have anti-viral medications to treat the viruses that cause this infection. They are not a "cure" and only shorten the course of the infection. These prescriptions are most effective when they are given within the first 2 days of "flu" symptoms. Antiviral medication are indicated if you have a high risk of complications from the flu. You should  also consider an antiviral medication if you are in close contact with someone who is at risk. These medications can help patients avoid complications from the flu  but have side effects that you should know. Possible side effects from Tamiflu or oseltamivir include nausea, vomiting, diarrhea, dizziness, headaches, eye redness, sleep problems or other respiratory symptoms. You should not take Tamiflu if you have an allergy to oseltamivir or any to the ingredients in Tamiflu.  Based upon your symptoms and potential risk factors I recommend that you follow the flu symptoms recommendation that I have listed below.  I have sent a work note to your MyChart it is from today through Monday as we can only extend for 3 days. If symptoms are not improved/resolving at that time and extension is needed, you will need a reassessment.   ANYONE WHO HAS FLU SYMPTOMS SHOULD: Stay home. The flu is highly contagious and going out or to work exposes others! Be sure to drink plenty of fluids. Water is fine as well as fruit juices, sodas and electrolyte beverages. You may want to stay away from caffeine or alcohol.  If you are nauseated, try taking small sips of liquids. How do you know if you are getting enough fluid? Your urine should be a pale yellow or almost colorless. Get rest. Taking a steamy shower or using a humidifier may help nasal congestion and ease sore throat pain. Using a saline nasal spray works much the same way. Cough drops, hard candies and sore throat lozenges may ease your cough. Line up a caregiver. Have someone check on you regularly.     GET HELP RIGHT AWAY IF: You cannot keep down liquids or your medications. You become short of breath Your fell like you are going to pass out or loose consciousness. Your symptoms persist after you have completed your treatment plan MAKE SURE YOU  Understand these instructions. Will watch your condition. Will get help right away if you are not doing well or get worse.  Your e-visit answers were reviewed by a board certified advanced clinical practitioner to complete your personal care plan.  Depending on the condition, your plan could have included both over the counter or prescription medications.  If there is a problem please reply  once you have received a response from your provider.  Your safety is important to [place].  If you have drug allergies check your prescription carefully.    You can use MyChart to ask questions about today's visit, request a non-urgent call back, or ask for a work or school excuse for 24 hours related to this e-Visit. If  it has been greater than 24 hours you will need to follow up with your provider, or enter a new e-Visit to address those concerns.  You will get an e-mail in the next two days asking about your experience.  I hope that your e-visit has been valuable and will speed your recovery. Thank you for using e-visits.

## 2021-03-23 NOTE — Progress Notes (Signed)
I have spent 5 minutes in review of e-visit questionnaire, review and updating patient chart, medical decision making and response to patient.   Vergene Marland Cody Dunya Meiners, PA-C    

## 2021-05-02 ENCOUNTER — Telehealth: Payer: 59 | Admitting: Family

## 2021-05-02 DIAGNOSIS — R6889 Other general symptoms and signs: Secondary | ICD-10-CM | POA: Diagnosis not present

## 2021-05-02 MED ORDER — BENZONATATE 100 MG PO CAPS: 100.0000 mg | ORAL_CAPSULE | Freq: Three times a day (TID) | ORAL | 0 refills | Status: DC | PRN

## 2021-05-02 MED ORDER — OSELTAMIVIR PHOSPHATE 75 MG PO CAPS: 75.0000 mg | ORAL_CAPSULE | Freq: Two times a day (BID) | ORAL | 0 refills | Status: DC

## 2021-05-02 NOTE — Progress Notes (Signed)

## 2021-05-29 ENCOUNTER — Ambulatory Visit (INDEPENDENT_AMBULATORY_CARE_PROVIDER_SITE_OTHER): Payer: 59 | Admitting: Psychology

## 2021-05-29 DIAGNOSIS — F411 Generalized anxiety disorder: Secondary | ICD-10-CM
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
## 2021-05-29 NOTE — Progress Notes (Signed)
? ? ? ? ? ? ? ? ? ? ? ? ? ? ?  Acy Orsak, LCMHC ?

## 2021-06-11 ENCOUNTER — Ambulatory Visit: Payer: Self-pay | Admitting: Psychology

## 2021-08-21 ENCOUNTER — Ambulatory Visit (INDEPENDENT_AMBULATORY_CARE_PROVIDER_SITE_OTHER): Payer: 59 | Admitting: Psychology

## 2021-08-21 DIAGNOSIS — F411 Generalized anxiety disorder: Secondary | ICD-10-CM | POA: Diagnosis not present

## 2021-08-21 NOTE — Progress Notes (Signed)
[facility] Counselor/Therapist Progress Note ? ?Patient ID: Don Howell, MRN: [id number],   ? ?Date: 08/21/2021 ? ?Time Spent: [id number]  pt is seen for a virtual video visit via caregility.  Pt joins from his home and counselor from her home office.   ? ?Treatment Type: Individual Therapy ? ?Reported Symptoms: anxiety, anxiety attacks ? ?Mental Status Exam: ?Appearance:  Well Groomed     ?Behavior: Appropriate  ?Motor: Normal  ?Speech/Language:  Normal Rate  ?Affect: Appropriate  ?Mood: anxious  ?Thought process: normal  ?Thought content:   WNL  ?Sensory/Perceptual disturbances:   WNL  ?Orientation: oriented to person, place, time/date, and situation  ?Attention: Good  ?Concentration: Good  ?Memory: WNL  ?Fund of knowledge:  Good  ?Insight:   Good  ?Judgment:  Good  ?Impulse Control: Good  ? ?Risk Assessment: ?Danger to Self:  No ?Self-injurious Behavior: No ?Danger to Others: No ?Duty to Warn:no ?Physical Aggression / Violence:No  ?Access to Firearms a concern: No  ?Gang Involvement:No  ? ?Subjective: Counselor assessed pt current functioning per pt report.  Processed w/ pt anxiety and current stressors.  Explored w/pt absence from counseling and any barriers.   Discussed ways of grounding and relaxing.  Led pt through Editor, commissioning, processed benefit and how to incorporate for self.  Pt affect wnl.  Pt reported still struggling w/ anxiety, ruminating worries and anxiety attacks at night.  Pt reported on stressors of work changes, worry for his health and work.  Pt reported that doesn't want to continue to rely on wife/others to manage through anxiety as impacting relationships.  Pt reported just forgot last appointment and procrastinating on rescheduling.  Pt participated in practice and reported relaxing.  Pt discussed ways to implement for self.  Pt reported friendship that had conflict 1.5 years ago and hadn't talk to since and then last week this person reached out.  Pt  discussed awkwardness of interaction and recognizing if going to move forward than needs to address past conflict.  ? ?Interventions: Cognitive Behavioral Therapy, Assertiveness/Communication, and Mindfulness Meditation ? ?Diagnosis:Generalized anxiety disorder ? ?Plan: Pt to f/u w/ counseling in 2 weeks.  Pt to f/u as scheduled w/ PCP.   ? ?Individualized Treatment Plan ?Strengths: seeking counseling, enjoys reading and watching t.v.  ?Supports: wife and friends  ?  ?Goal/Needs for Treatment:  ?In order of importance to patient ?1) manage anxiety ?2) - ?3) -  ?  ?Client Statement of Needs:  "(reducing) my anxiety and working on that because it has been tough. "   ?  ?Treatment Level:outpatient counseling  ?Symptoms:anxiety, worry, fatigue, sleep disturbance.  ?Client Treatment Preferences:biweekly counseling.  Not opposed to medication but would like to try to manage w/out medication.    ?  ?Healthcare consumer's goal for treatment: ?  ?Counselor, Jan Fireman, [facility] will support the patient's ability to achieve the goals identified. Cognitive Behavioral Therapy, Assertive Communication/Conflict Resolution Training, Relaxation Training, ACT, Humanistic and other evidenced-based practices will be used to promote progress towards healthy functioning.  ?  ?Healthcare consumer will: Actively participate in therapy, working towards healthy functioning.  ?   ?*Justification for Continuation/Discontinuation of Goal: R=Revised, O=Ongoing, A=Achieved, D=Discontinued ?  ?Goal 1) To increase effective coping skills to assist coping w/ anxiety and life stressors AEB pt utilizing relaxation/grounding/deescalating skills, identifying and reframing thoughts that exacerbate anxiety and reporting decreased intensity and frequency of anxiety.  ?Baseline date 05/29/21: Progress towards goal 0; How Often - Daily ?Target Date Goal Was reviewed Status  Code Progress towards goal  ?05/29/22        ?         ?         ?This plan has been  reviewed and created by the following participants:  This plan will be reviewed at least every 12 months. ?Date Behavioral Health Clinician Date Guardian/Patient   ?05/29/21  Jan Fireman, [facility]        05/29/21 Verbal Consent provided.   ? ? Jan Fireman, [facility] ? ? ? ?

## 2021-08-21 NOTE — Progress Notes (Signed)
? ? ? ? ? ? ? ? ? ? ? ? ? ? ?  Don Howell, LCMHC ?

## 2021-09-03 ENCOUNTER — Ambulatory Visit (INDEPENDENT_AMBULATORY_CARE_PROVIDER_SITE_OTHER): Payer: 59 | Admitting: Psychology

## 2021-09-03 DIAGNOSIS — F411 Generalized anxiety disorder: Secondary | ICD-10-CM | POA: Diagnosis not present

## 2021-09-03 NOTE — Progress Notes (Signed)
[facility] Counselor/Therapist Progress Note  Patient ID: Don Howell, MRN: [id number],    Date: 09/03/2021  Time Spent: 11:08am-11.58am  pt is seen for a virtual video visit via caregility.  Pt joins from his home and counselor from her home office.    Treatment Type: Individual Therapy  Reported Symptoms: anxiety, anxiety attacks  Mental Status Exam: Appearance:  Well Groomed     Behavior: Appropriate  Motor: Normal  Speech/Language:  Normal Rate  Affect: Appropriate  Mood: anxious  Thought process: normal  Thought content:   WNL  Sensory/Perceptual disturbances:   WNL  Orientation: oriented to person, place, time/date, and situation  Attention: Good  Concentration: Good  Memory: WNL  Fund of knowledge:  Good  Insight:   Good  Judgment:  Good  Impulse Control: Good   Risk Assessment: Danger to Self:  No Self-injurious Behavior: No Danger to Others: No Duty to Warn:no Physical Aggression / Violence:No  Access to Firearms a concern: No  Gang Involvement:No   Subjective: Counselor assessed pt current functioning per pt report.  Processed w/ pt  recent anxiety attack.  Discussed use of grounding.  Provided psychoeducation re: mindfulness and ways of practicing.  Explored pt resources that have assisted through emotional escalations in the past.   Pt affect wnl.  Pt reported that lost sleep last night after ruminating and emotional escalation re: anxiety and resentment.  Pt reported that was able to eventual recognize that couldn't resolve anything at that time.  Pt discussed some use of grounding practice.  Pt receptive to mindful approach and practices.  Pt was able to identify some practice of writing to vent and insights that help him get through anxiety.     Interventions: Cognitive Behavioral Therapy, Assertiveness/Communication, and Mindfulness Meditation  Diagnosis:Generalized anxiety disorder  Plan: Pt to f/u w/ counseling in 2  weeks.  Pt to f/u as scheduled w/ PCP.    Individualized Treatment Plan Strengths: seeking counseling, enjoys reading and watching t.v.  Supports: wife and friends    Goal/Needs for Treatment:  In order of importance to patient 1) manage anxiety 2) - 3) -    Client Statement of Needs:  "(reducing) my anxiety and working on that because it has been tough. "     Treatment Level:outpatient counseling  Symptoms:anxiety, worry, fatigue, sleep disturbance.  Client Treatment Preferences:biweekly counseling.  Not opposed to medication but would like to try to manage w/out medication.      Healthcare consumer's goal for treatment:   Counselor, Forde Radon, [facility] will support the patient's ability to achieve the goals identified. Cognitive Behavioral Therapy, Assertive Communication/Conflict Resolution Training, Relaxation Training, ACT, Humanistic and other evidenced-based practices will be used to promote progress towards healthy functioning.    Healthcare consumer will: Actively participate in therapy, working towards healthy functioning.     *Justification for Continuation/Discontinuation of Goal: R=Revised, O=Ongoing, A=Achieved, D=Discontinued   Goal 1) To increase effective coping skills to assist coping w/ anxiety and life stressors AEB pt utilizing relaxation/grounding/deescalating skills, identifying and reframing thoughts that exacerbate anxiety and reporting decreased intensity and frequency of anxiety.  Baseline date 05/29/21: Progress towards goal 0; How Often - Daily Target Date Goal Was reviewed Status Code Progress towards goal  05/29/22                          This plan has been reviewed and created by the following  participants:  This plan will be reviewed at least every 12 months. Date Behavioral Health Clinician Date Guardian/Patient   05/29/21  Forde Radon, [facility]        05/29/21 Verbal Consent provided.     Forde Radon [facility]           [place], [facility]

## 2021-09-17 ENCOUNTER — Ambulatory Visit (INDEPENDENT_AMBULATORY_CARE_PROVIDER_SITE_OTHER): Payer: 59 | Admitting: Psychology

## 2021-09-17 DIAGNOSIS — F411 Generalized anxiety disorder: Secondary | ICD-10-CM

## 2021-09-17 NOTE — Progress Notes (Signed)
[facility] Counselor/Therapist Progress Note  Patient ID: Don Howell, MRN: [id number],    Date: 09/17/2021  Time Spent: 1:30pm-2:24pm  pt is seen for a virtual video visit via caregility.  Pt joins from his home and counselor from her home office.    Treatment Type: Individual Therapy  Reported Symptoms: anxiety, conflict w/ wife  Mental Status Exam: Appearance:  Well Groomed     Behavior: Appropriate  Motor: Normal  Speech/Language:  Normal Rate  Affect: Appropriate  Mood: worry  Thought process: normal  Thought content:   WNL  Sensory/Perceptual disturbances:   WNL  Orientation: oriented to person, place, time/date, and situation  Attention: Good  Concentration: Good  Memory: WNL  Fund of knowledge:  Good  Insight:   Good  Judgment:  Good  Impulse Control: Good   Risk Assessment: Danger to Self:  No Self-injurious Behavior: No Danger to Others: No Duty to Warn:no Physical Aggression / Violence:No  Access to Firearms a concern: No  Gang Involvement:No   Subjective: Counselor assessed pt current functioning per pt report.  Processed w/ pt  recent conflict w/ wife and how feeling uneasy that not resolved for her.  Explored w/pt hx of family of origin and how impacts.  Discussed ways of being assertive in communication/conflict resolution and being consisent w/ his values and not trying to resolve the other's feelings. Encouraged mindful approach w/ own emotions of uneasiness.   Pt affect wnl.  Pt reported recent conflict w/ wife due to his hesitance when offered support current that was denied in past. They had both expressed feelings re: and pt discussed came back up couple weeks later.  Pt struggles to understand her emotions and not wanting to placate.  Pt discussed values of being open and honest.  Pt also discussed hx of arguments in past and not wanting to return to those.  Pt was able to acknowledge that her emotions not resolved making  him uneasy and role his past plays in.    Interventions: Cognitive Behavioral Therapy, Assertiveness/Communication, and Mindfulness Meditation  Diagnosis:Generalized anxiety disorder  Plan: Pt to f/u w/ counseling in 2 weeks.  Pt to f/u as scheduled w/ PCP.    Individualized Treatment Plan Strengths: seeking counseling, enjoys reading and watching t.v.  Supports: wife and friends    Goal/Needs for Treatment:  In order of importance to patient 1) manage anxiety 2) - 3) -    Client Statement of Needs:  "(reducing) my anxiety and working on that because it has been tough. "     Treatment Level:outpatient counseling  Symptoms:anxiety, worry, fatigue, sleep disturbance.  Client Treatment Preferences:biweekly counseling.  Not opposed to medication but would like to try to manage w/out medication.      Healthcare consumer's goal for treatment:   Counselor, Forde Radon, [facility] will support the patient's ability to achieve the goals identified. Cognitive Behavioral Therapy, Assertive Communication/Conflict Resolution Training, Relaxation Training, ACT, Humanistic and other evidenced-based practices will be used to promote progress towards healthy functioning.    Healthcare consumer will: Actively participate in therapy, working towards healthy functioning.     *Justification for Continuation/Discontinuation of Goal: R=Revised, O=Ongoing, A=Achieved, D=Discontinued   Goal 1) To increase effective coping skills to assist coping w/ anxiety and life stressors AEB pt utilizing relaxation/grounding/deescalating skills, identifying and reframing thoughts that exacerbate anxiety and reporting decreased intensity and frequency of anxiety.  Baseline date 05/29/21: Progress towards goal 0; How Often -  Daily Target Date Goal Was reviewed Status Code Progress towards goal  05/29/22                          This plan has been reviewed and created by the following participants:  This plan will be  reviewed at least every 12 months. Date Behavioral Health Clinician Date Guardian/Patient   05/29/21  Forde Radon, [facility]        05/29/21 Verbal Consent provided.      Forde Radon, [facility]

## 2021-10-01 ENCOUNTER — Ambulatory Visit (INDEPENDENT_AMBULATORY_CARE_PROVIDER_SITE_OTHER): Payer: 59 | Admitting: Psychology

## 2021-10-01 DIAGNOSIS — F411 Generalized anxiety disorder: Secondary | ICD-10-CM

## 2021-10-01 NOTE — Progress Notes (Signed)
[facility] Counselor/Therapist Progress Note  Patient ID: Don Howell, MRN: [id number],    Date: 10/01/2021  Time Spent: 11:02am-11:52am  pt is seen for a virtual video visit via caregility.  Pt joins from his home and counselor from her home office.    Treatment Type: Individual Therapy  Reported Symptoms: anxiety, conflict w/ wife  Mental Status Exam: Appearance:  Well Groomed     Behavior: Appropriate  Motor: Normal  Speech/Language:  Normal Rate  Affect: Appropriate  Mood: anxious  Thought process: normal  Thought content:   WNL  Sensory/Perceptual disturbances:   WNL  Orientation: oriented to person, place, time/date, and situation  Attention: Good  Concentration: Good  Memory: WNL  Fund of knowledge:  Good  Insight:   Good  Judgment:  Good  Impulse Control: Good   Risk Assessment: Danger to Self:  No Self-injurious Behavior: No Danger to Others: No Duty to Warn:no Physical Aggression / Violence:No  Access to Firearms a concern: No  Gang Involvement:No   Subjective: Counselor assessed pt current functioning per pt report.  Processed w/ pt interactions w/ wife, expressing emotions and conflict. Validated and normalized emotions. Reflected positives in being able to express and work through conflict.  Discussed negative self talk following and how to approach w/ self compassion.    Pt affect wnl.  Pt reported that overall anxiety reduced and no panic attacks.  Pt reports that over weekend big talks and emotions w/ wife when differing perspectives on whether adopt cat found or not.  Pt able to assert emotions and his thoughts and listen to wife's as well.  Pt concerned that not respected w/ differing goals and wants.  Pt discussed how afterwards can get  into a self loathing and very critical of how handled.  Pt was able to identifying reframe and acknowledged positives in how expressed.   Interventions: Cognitive Behavioral Therapy,  Assertiveness/Communication, and Mindfulness Meditation  Diagnosis:Generalized anxiety disorder  Plan: Pt to f/u w/ counseling in 2 weeks.  Pt to f/u as scheduled w/ PCP.    Individualized Treatment Plan Strengths: seeking counseling, enjoys reading and watching t.v.  Supports: wife and friends    Goal/Needs for Treatment:  In order of importance to patient 1) manage anxiety 2) - 3) -    Client Statement of Needs:  "(reducing) my anxiety and working on that because it has been tough. "     Treatment Level:outpatient counseling  Symptoms:anxiety, worry, fatigue, sleep disturbance.  Client Treatment Preferences:biweekly counseling.  Not opposed to medication but would like to try to manage w/out medication.      Healthcare consumer's goal for treatment:   Counselor, Forde Radon, [facility] will support the patient's ability to achieve the goals identified. Cognitive Behavioral Therapy, Assertive Communication/Conflict Resolution Training, Relaxation Training, ACT, Humanistic and other evidenced-based practices will be used to promote progress towards healthy functioning.    Healthcare consumer will: Actively participate in therapy, working towards healthy functioning.     *Justification for Continuation/Discontinuation of Goal: R=Revised, O=Ongoing, A=Achieved, D=Discontinued   Goal 1) To increase effective coping skills to assist coping w/ anxiety and life stressors AEB pt utilizing relaxation/grounding/deescalating skills, identifying and reframing thoughts that exacerbate anxiety and reporting decreased intensity and frequency of anxiety.  Baseline date 05/29/21: Progress towards goal 0; How Often - Daily Target Date Goal Was reviewed Status Code Progress towards goal  05/29/22  This plan has been reviewed and created by the following participants:  This plan will be reviewed at least every 12 months. Date Behavioral Health Clinician Date Guardian/Patient    05/29/21  Forde Radon, [facility]        05/29/21 Verbal Consent provided.        Forde Radon, [facility]

## 2021-10-15 ENCOUNTER — Ambulatory Visit (INDEPENDENT_AMBULATORY_CARE_PROVIDER_SITE_OTHER): Payer: 59 | Admitting: Psychology

## 2021-10-15 DIAGNOSIS — F411 Generalized anxiety disorder: Secondary | ICD-10-CM

## 2021-10-15 NOTE — Progress Notes (Signed)
[facility] Counselor/Therapist Progress Note  Patient ID: Don Howell, MRN: [id number],    Date: 10/15/2021  Time Spent: 10:07am-10:58am  pt is seen for a virtual video visit via caregility.  Pt joins from his home and counselor from her home office.    Treatment Type: Individual Therapy  Reported Symptoms: 1 anxiety attack, conflict resolved  Mental Status Exam: Appearance:  Well Groomed     Behavior: Appropriate  Motor: Normal  Speech/Language:  Normal Rate  Affect: Appropriate  Mood: anxious  Thought process: normal  Thought content:   WNL  Sensory/Perceptual disturbances:   WNL  Orientation: oriented to person, place, time/date, and situation  Attention: Good  Concentration: Good  Memory: WNL  Fund of knowledge:  Good  Insight:   Good  Judgment:  Good  Impulse Control: Good   Risk Assessment: Danger to Self:  No Self-injurious Behavior: No Danger to Others: No Duty to Warn:no Physical Aggression / Violence:No  Access to Firearms a concern: No  Gang Involvement:No   Subjective: Counselor assessed pt current functioning per pt report.  Processed w/ pt conflict resolution and effective communication.  Explored w/pt recent anxiety attacks and recognizing anxiety indicators.  Explored how coped through and acknowledged progress w/ less frequent attacks. Encouraged continued mindful approaches and managing daily stressors.  Discussed how to set boundaries to no take on others stress.  Pt affect wnl.  Pt reported that experienced one anxiety attack that lasted couple hours.  Pt is able to identifying contributing factors that day.  Pt discussed difficulty in recognizing when normal worrying is becoming more intense.  Pt receptive to continued mindful practice.  Pt reported that he and wife communication good and resolved w/ conflict and feels understood.  Pt discussed how coworkers Conservation officer, historic buildings tend to vent to him w/ their frustrations and at times becomes  stressor for him.  Pt is identifying how can set boundaries.   Interventions: Cognitive Behavioral Therapy, Assertiveness/Communication, and Mindfulness Meditation  Diagnosis:Generalized anxiety disorder  Plan: Pt to f/u w/ counseling in 2 weeks.  Pt to f/u as scheduled w/ PCP.    Individualized Treatment Plan Strengths: seeking counseling, enjoys reading and watching t.v.  Supports: wife and friends    Goal/Needs for Treatment:  In order of importance to patient 1) manage anxiety 2) - 3) -    Client Statement of Needs:  "(reducing) my anxiety and working on that because it has been tough. "     Treatment Level:outpatient counseling  Symptoms:anxiety, worry, fatigue, sleep disturbance.  Client Treatment Preferences:biweekly counseling.  Not opposed to medication but would like to try to manage w/out medication.      Healthcare consumer's goal for treatment:   Counselor, Forde Radon, [facility] will support the patient's ability to achieve the goals identified. Cognitive Behavioral Therapy, Assertive Communication/Conflict Resolution Training, Relaxation Training, ACT, Humanistic and other evidenced-based practices will be used to promote progress towards healthy functioning.    Healthcare consumer will: Actively participate in therapy, working towards healthy functioning.     *Justification for Continuation/Discontinuation of Goal: R=Revised, O=Ongoing, A=Achieved, D=Discontinued   Goal 1) To increase effective coping skills to assist coping w/ anxiety and life stressors AEB pt utilizing relaxation/grounding/deescalating skills, identifying and reframing thoughts that exacerbate anxiety and reporting decreased intensity and frequency of anxiety.  Baseline date 05/29/21: Progress towards goal 0; How Often - Daily Target Date Goal Was reviewed Status Code Progress towards goal  05/29/22  This plan has been reviewed and created by the following participants:  This  plan will be reviewed at least every 12 months. Date Behavioral Health Clinician Date Guardian/Patient   05/29/21  Forde Radon, [facility]        05/29/21 Verbal Consent provided.        Forde Radon, [facility]

## 2021-10-26 ENCOUNTER — Telehealth: Payer: 59 | Admitting: Physician Assistant

## 2021-10-26 DIAGNOSIS — B9789 Other viral agents as the cause of diseases classified elsewhere: Secondary | ICD-10-CM | POA: Diagnosis not present

## 2021-10-26 DIAGNOSIS — J019 Acute sinusitis, unspecified: Secondary | ICD-10-CM | POA: Diagnosis not present

## 2021-10-26 MED ORDER — AZELASTINE HCL 0.1 % NA SOLN: 1.0000 | Freq: Two times a day (BID) | NASAL | 0 refills | Status: DC

## 2021-10-26 NOTE — Progress Notes (Signed)

## 2021-10-26 NOTE — Progress Notes (Signed)
I have spent 5 minutes in review of e-visit questionnaire, review and updating patient chart, medical decision making and response to patient.   Tyre Beaver Cody Tarun Patchell, PA-C    

## 2021-11-05 ENCOUNTER — Ambulatory Visit (INDEPENDENT_AMBULATORY_CARE_PROVIDER_SITE_OTHER): Payer: 59 | Admitting: Psychology

## 2021-11-05 DIAGNOSIS — F411 Generalized anxiety disorder: Secondary | ICD-10-CM

## 2021-11-05 NOTE — Progress Notes (Signed)
[facility] Counselor/Therapist Progress Note  Patient ID: Don Howell, MRN: [id number],    Date: 11/05/2021  Time Spent: 12:02pm-12:49am  pt is seen for a virtual video visit via caregility.  Pt joins from his home and counselor from her home office.    Treatment Type: Individual Therapy  Reported Symptoms:  increased stressors and anxiety last week.   Mental Status Exam: Appearance:  Well Groomed     Behavior: Appropriate  Motor: Normal  Speech/Language:  Normal Rate  Affect: Appropriate  Mood: anxious  Thought process: normal  Thought content:   WNL  Sensory/Perceptual disturbances:   WNL  Orientation: oriented to person, place, time/date, and situation  Attention: Good  Concentration: Good  Memory: WNL  Fund of knowledge:  Good  Insight:   Good  Judgment:  Good  Impulse Control: Good   Risk Assessment: Danger to Self:  No Self-injurious Behavior: No Danger to Others: No Duty to Warn:no Physical Aggression / Violence:No  Access to Firearms a concern: No  Gang Involvement:No   Subjective: Counselor assessed pt current functioning per pt report.  Processed w/ pt increased stressor and anxiety this past week.  Explored ways managed stressors and expressed concerns.  Discussed communication and ways to continue increasing effective communication.  Processed positives of managing stress and accepting struggles w/ self compassion.   Pt affect wnl.  Pt reported last week very stressful between work stressors and home stressors.  Pt acknowledged that didn't communication concerns for home stress initially and this exacerbated.  Pt reported that did discuss and resolve but need to discuss w/ partner how to approach this incident in future. Pt reported putting self down for how escalated in anxiety and actions.  Pt was able to identify that difficult to have self compassion w/ mistakes. Pt was able to identify and reframe for self compassion response.     Interventions: Cognitive Behavioral Therapy, Assertiveness/Communication, and aCT  Diagnosis:Generalized anxiety disorder  Plan: Pt to f/u w/ counseling in 2 weeks.  Pt to f/u as scheduled w/ PCP.    Individualized Treatment Plan Strengths: seeking counseling, enjoys reading and watching t.v.  Supports: wife and friends    Goal/Needs for Treatment:  In order of importance to patient 1) manage anxiety 2) - 3) -    Client Statement of Needs:  "(reducing) my anxiety and working on that because it has been tough. "     Treatment Level:outpatient counseling  Symptoms:anxiety, worry, fatigue, sleep disturbance.  Client Treatment Preferences:biweekly counseling.  Not opposed to medication but would like to try to manage w/out medication.      Healthcare consumer's goal for treatment:   Counselor, Forde Radon, [facility] will support the patient's ability to achieve the goals identified. Cognitive Behavioral Therapy, Assertive Communication/Conflict Resolution Training, Relaxation Training, ACT, Humanistic and other evidenced-based practices will be used to promote progress towards healthy functioning.    Healthcare consumer will: Actively participate in therapy, working towards healthy functioning.     *Justification for Continuation/Discontinuation of Goal: R=Revised, O=Ongoing, A=Achieved, D=Discontinued   Goal 1) To increase effective coping skills to assist coping w/ anxiety and life stressors AEB pt utilizing relaxation/grounding/deescalating skills, identifying and reframing thoughts that exacerbate anxiety and reporting decreased intensity and frequency of anxiety.  Baseline date 05/29/21: Progress towards goal 0; How Often - Daily Target Date Goal Was reviewed Status Code Progress towards goal  05/29/22  This plan has been reviewed and created by the following participants:  This plan will be reviewed at least every 12 months. Date Behavioral Health  Clinician Date Guardian/Patient   05/29/21  Forde Radon, [facility]        05/29/21 Verbal Consent provided.       Forde Radon, [facility]

## 2021-11-19 ENCOUNTER — Ambulatory Visit (INDEPENDENT_AMBULATORY_CARE_PROVIDER_SITE_OTHER): Payer: 59 | Admitting: Psychology

## 2021-11-19 DIAGNOSIS — F411 Generalized anxiety disorder: Secondary | ICD-10-CM | POA: Diagnosis not present

## 2021-11-19 NOTE — Progress Notes (Signed)
[facility] Counselor/Therapist Progress Note  Patient ID: Don Howell, MRN: [id number],    Date: 11/19/2021  Time Spent: 9:02am-9:47am  pt is seen for a virtual video visit via caregility.  Pt joins from his home and counselor from her office.    Treatment Type: Individual Therapy  Reported Symptoms:  recent separation from wife, some anxiety and some relief.  Mental Status Exam: Appearance:  Well Groomed     Behavior: Appropriate  Motor: Normal  Speech/Language:  Normal Rate  Affect: Appropriate  Mood: anxious  Thought process: normal  Thought content:   WNL  Sensory/Perceptual disturbances:   WNL  Orientation: oriented to person, place, time/date, and situation  Attention: Good  Concentration: Good  Memory: WNL  Fund of knowledge:  Good  Insight:   Good  Judgment:  Good  Impulse Control: Good   Risk Assessment: Danger to Self:  No Self-injurious Behavior: No Danger to Others: No Duty to Warn:no Physical Aggression / Violence:No  Access to Firearms a concern: No  Gang Involvement:No   Subjective: Counselor assessed pt current functioning per pt report.  Processed w/ pt separation from wife and related emotions.  Explored decision process and adjustment ahead.  Discussed taking time for processing through change and continued focus on self growth.  Acknowledged losses and related emotions.   Pt affect wnl.  Pt reported over weekend made biggest decision for self and separated wife. Pt discussed how emotions have been present for a long time and has acknowledged that things not healthy for him.  Pt discussed over last week recognized decision that had to make to move forward.  Pt discussed how has anxiety and overwhelmed about figuring out things financial and change moving forward.  Pt also express how feels relief and that has made the best decision.  Pt identified how still loss and those emotions as never anticipated a separation.  Pt discussed  supports he has moving forward and decisions he is making to figure things out financially and w/ pets.    Interventions: Cognitive Behavioral Therapy, Assertiveness/Communication, and aCT  Diagnosis:Generalized anxiety disorder  Plan: Pt to f/u w/ counseling in 2 weeks.  Pt to f/u as scheduled w/ PCP.    Individualized Treatment Plan Strengths: seeking counseling, enjoys reading and watching t.v.  Supports: wife and friends    Goal/Needs for Treatment:  In order of importance to patient 1) manage anxiety 2) - 3) -    Client Statement of Needs:  "(reducing) my anxiety and working on that because it has been tough. "     Treatment Level:outpatient counseling  Symptoms:anxiety, worry, fatigue, sleep disturbance.  Client Treatment Preferences:biweekly counseling.  Not opposed to medication but would like to try to manage w/out medication.      Healthcare consumer's goal for treatment:   Counselor, Forde Radon, [facility] will support the patient's ability to achieve the goals identified. Cognitive Behavioral Therapy, Assertive Communication/Conflict Resolution Training, Relaxation Training, ACT, Humanistic and other evidenced-based practices will be used to promote progress towards healthy functioning.    Healthcare consumer will: Actively participate in therapy, working towards healthy functioning.     *Justification for Continuation/Discontinuation of Goal: R=Revised, O=Ongoing, A=Achieved, D=Discontinued   Goal 1) To increase effective coping skills to assist coping w/ anxiety and life stressors AEB pt utilizing relaxation/grounding/deescalating skills, identifying and reframing thoughts that exacerbate anxiety and reporting decreased intensity and frequency of anxiety.  Baseline date 05/29/21: Progress towards goal 0; How Often - Daily Target  Date Goal Was reviewed Status Code Progress towards goal  05/29/22                          This plan has been reviewed and created by the  following participants:  This plan will be reviewed at least every 12 months. Date Behavioral Health Clinician Date Guardian/Patient   05/29/21  Forde Radon, [facility]        05/29/21 Verbal Consent provided.         Forde Radon, [facility]

## 2021-12-03 ENCOUNTER — Ambulatory Visit (INDEPENDENT_AMBULATORY_CARE_PROVIDER_SITE_OTHER): Payer: 59 | Admitting: Psychology

## 2021-12-03 DIAGNOSIS — F411 Generalized anxiety disorder: Secondary | ICD-10-CM | POA: Diagnosis not present

## 2021-12-03 NOTE — Progress Notes (Signed)
[facility] Counselor/Therapist Progress Note  Patient ID: Don Howell, MRN: [id number],    Date: 12/03/2021  Time Spent: 10:01am-10:50am  pt is seen for a virtual video visit via caregility.  Pt joins from his home and counselor from her home office.    Treatment Type: Individual Therapy  Reported Symptoms: increased insight re: unhealthy relationship patterns, worry for repeating.  Mental Status Exam: Appearance:  Well Groomed     Behavior: Appropriate  Motor: Normal  Speech/Language:  Normal Rate  Affect: Appropriate  Mood: anxious  Thought process: normal  Thought content:   WNL  Sensory/Perceptual disturbances:   WNL  Orientation: oriented to person, place, time/date, and situation  Attention: Good  Concentration: Good  Memory: WNL  Fund of knowledge:  Good  Insight:   Good  Judgment:  Good  Impulse Control: Good   Risk Assessment: Danger to Self:  No Self-injurious Behavior: No Danger to Others: No Duty to Warn:no Physical Aggression / Violence:No  Access to Firearms a concern: No  Gang Involvement:No   Subjective: Counselor assessed pt current functioning per pt report.  Processed w/ pt emotions re: insight of relationship patterns.  Validated range of emotions and normalized.  Discussed unhealthy patterns that began in family of origin. Reflected positives and strengths that bring to relationships as well. Explored ways of engaging and evaluating relationships that healthy and setting healthy boundaries.  Discussed what pt values and identifying things wants/need personally and in relationships.   Pt affect wnl.  Pt reported that has been feeling range of emotions as adjusts to focus on self and no longer being in relationship. Pt discussed resentment feels and how upset at self for not recognize unhealthy patterns earlier.  Pt discussed insight into how much relationship was impacting in negative ways.  Pt was able to reflect on giving self  grace and factors that played into getting into and staying in relationship.  Pt reported on always being in role of taking care of others emotionally and being the responsible one.  Pt discussed how feels less stressed as well.  Pt discussed want to reconnect w/ relationships that had shifted away from while in this relationship and also worried for repeating same unhealthy patterns.   Interventions: Cognitive Behavioral Therapy, Interpersonal, and aCT  Diagnosis:Generalized anxiety disorder  Plan: Pt to f/u w/ counseling in 2 weeks.  Pt to f/u as scheduled w/ PCP.    Individualized Treatment Plan Strengths: seeking counseling, enjoys reading and watching t.v.  Supports: wife and friends    Goal/Needs for Treatment:  In order of importance to patient 1) manage anxiety 2) - 3) -    Client Statement of Needs:  "(reducing) my anxiety and working on that because it has been tough. "     Treatment Level:outpatient counseling  Symptoms:anxiety, worry, fatigue, sleep disturbance.  Client Treatment Preferences:biweekly counseling.  Not opposed to medication but would like to try to manage w/out medication.      Healthcare consumer's goal for treatment:   Counselor, Forde Radon, [facility] will support the patient's ability to achieve the goals identified. Cognitive Behavioral Therapy, Assertive Communication/Conflict Resolution Training, Relaxation Training, ACT, Humanistic and other evidenced-based practices will be used to promote progress towards healthy functioning.    Healthcare consumer will: Actively participate in therapy, working towards healthy functioning.     *Justification for Continuation/Discontinuation of Goal: R=Revised, O=Ongoing, A=Achieved, D=Discontinued   Goal 1) To increase effective coping skills to assist coping w/ anxiety and  life stressors AEB pt utilizing relaxation/grounding/deescalating skills, identifying and reframing thoughts that exacerbate anxiety and reporting  decreased intensity and frequency of anxiety.  Baseline date 05/29/21: Progress towards goal 0; How Often - Daily Target Date Goal Was reviewed Status Code Progress towards goal  05/29/22                          This plan has been reviewed and created by the following participants:  This plan will be reviewed at least every 12 months. Date Behavioral Health Clinician Date Guardian/Patient   05/29/21  Forde Radon, [facility]        05/29/21 Verbal Consent provided.          Forde Radon, [facility]

## 2021-12-05 ENCOUNTER — Telehealth: Payer: 59 | Admitting: Physician Assistant

## 2021-12-05 DIAGNOSIS — R6884 Jaw pain: Secondary | ICD-10-CM

## 2021-12-05 DIAGNOSIS — H9201 Otalgia, right ear: Secondary | ICD-10-CM

## 2021-12-05 MED ORDER — CIPROFLOXACIN-DEXAMETHASONE 0.3-0.1 % OT SUSP: 4.0000 [drp] | Freq: Two times a day (BID) | OTIC | 0 refills | Status: DC

## 2021-12-05 NOTE — Progress Notes (Signed)
E Visit for Swimmer's Ear  We are sorry that you are not feeling well. Here is how we plan to help!  It sounds like you may have possible otitis externa (infection of the external ear canal) versus TMJ (temporomandibular joint) pain.   I will prescribe Ciprodex ear drops for possible infection/inflammation there. If this does not help, it may be best to see your Primary care provider or your dentist for better evaluation. Dentist will treat TMJ disorders.   In certain cases swimmer's ear may progress to a more serious bacterial infection of the middle or inner ear.  If you have a fever 102 and up and significantly worsening symptoms, this could indicate a more serious infection moving to the middle/inner and needs face to face evaluation in an office by a provider.  Your symptoms should improve over the next 3 days and should resolve in about 7 days.  HOME CARE:  Wash your hands frequently. Do not place the tip of the bottle on your ear or touch it with your fingers. You can take Acetominophen 650 mg every 4-6 hours as needed for pain.  If pain is severe or moderate, you can apply a heating pad (set on low) or hot water bottle (wrapped in a towel) to outer ear for 20 minutes.  This will also increase drainage. Avoid ear plugs Do not use Q-tips After showers, help the water run out by tilting your head to one side.  GET HELP RIGHT AWAY IF:  Fever is over 102.2 degrees. You develop progressive ear pain or hearing loss. Ear symptoms persist longer than 3 days after treatment.  MAKE SURE YOU:  Understand these instructions. Will watch your condition. Will get help right away if you are not doing well or get worse.  TO PREVENT SWIMMER'S EAR: Use a bathing cap or custom fitted swim molds to keep your ears dry. Towel off after swimming to dry your ears. Tilt your head or pull your earlobes to allow the water to escape your ear canal. If there is still water in your ears, consider using  a hairdryer on the lowest setting.   Thank you for choosing an e-visit.  Your e-visit answers were reviewed by a board certified advanced clinical practitioner to complete your personal care plan. Depending upon the condition, your plan could have included both over the counter or prescription medications.  Please review your pharmacy choice. Make sure the pharmacy is open so you can pick up prescription now. If there is a problem, you may contact your provider through [employer] and have the prescription routed to another pharmacy.  Your safety is important to [place]. If you have drug allergies check your prescription carefully.   For the next 24 hours you can use MyChart to ask questions about today's visit, request a non-urgent call back, or ask for a work or school excuse. You will get an email in the next two days asking about your experience. I hope that your e-visit has been valuable and will speed your recovery.   I provided 5 minutes of non face-to-face time during this encounter for chart review and documentation.

## 2021-12-19 ENCOUNTER — Ambulatory Visit (INDEPENDENT_AMBULATORY_CARE_PROVIDER_SITE_OTHER): Payer: 59 | Admitting: Psychology

## 2021-12-19 DIAGNOSIS — F411 Generalized anxiety disorder: Secondary | ICD-10-CM

## 2021-12-19 NOTE — Progress Notes (Signed)
[facility] Counselor/Therapist Progress Note  Patient ID: Don Howell, MRN: [id number],    Date: 12/19/2021  Time Spent: 11:05am-11:50am  pt is seen for a virtual video visit via caregility.  Pt joins from his home and counselor from her home office.    Treatment Type: Individual Therapy  Reported Symptoms: decreased anxiety, increased insight/awareness  Mental Status Exam: Appearance:  Well Groomed     Behavior: Appropriate  Motor: Normal  Speech/Language:  Normal Rate  Affect: Appropriate  Mood: normal  Thought process: normal  Thought content:   WNL  Sensory/Perceptual disturbances:   WNL  Orientation: oriented to person, place, time/date, and situation  Attention: Good  Concentration: Good  Memory: WNL  Fund of knowledge:  Good  Insight:   Good  Judgment:  Good  Impulse Control: Good   Risk Assessment: Danger to Self:  No Self-injurious Behavior: No Danger to Others: No Duty to Warn:no Physical Aggression / Violence:No  Access to Firearms a concern: No  Gang Involvement:No   Subjective: Counselor assessed pt current functioning per pt report.  Processed w/ pt separation, emotions and decreased anxiety.   Reflected insights and awareness gained.  Validated range of emotions and normalized.  Assisted pt in recognizing having limitations is ok and to accept.  Pt affect wnl.  Pt reported that has been coping through range of emotions w/ separation, but overall feels that mood improved and feeling in better space.  Pt reports that has been able to recognize less anxiety, less panic attacks and increased feeling of being able to cope through.  Pt reported that he is focused on what he can control and feeling more confident that can't handle things.  Pt discussed how he has been trying to fix things for others and that when couldn't would think he had to do more or be better.  Pt recognized that does have limits and that need to work towards accepting  these.   Interventions: Cognitive Behavioral Therapy, Interpersonal, and aCT  Diagnosis:Generalized anxiety disorder  Plan: Pt to f/u w/ counseling in 2 weeks.  Pt to f/u as scheduled w/ PCP.    Individualized Treatment Plan Strengths: seeking counseling, enjoys reading and watching t.v.  Supports: wife and friends    Goal/Needs for Treatment:  In order of importance to patient 1) manage anxiety 2) - 3) -    Client Statement of Needs:  "(reducing) my anxiety and working on that because it has been tough. "     Treatment Level:outpatient counseling  Symptoms:anxiety, worry, fatigue, sleep disturbance.  Client Treatment Preferences:biweekly counseling.  Not opposed to medication but would like to try to manage w/out medication.      Healthcare consumer's goal for treatment:   Counselor, Forde Radon, [facility] will support the patient's ability to achieve the goals identified. Cognitive Behavioral Therapy, Assertive Communication/Conflict Resolution Training, Relaxation Training, ACT, Humanistic and other evidenced-based practices will be used to promote progress towards healthy functioning.    Healthcare consumer will: Actively participate in therapy, working towards healthy functioning.     *Justification for Continuation/Discontinuation of Goal: R=Revised, O=Ongoing, A=Achieved, D=Discontinued   Goal 1) To increase effective coping skills to assist coping w/ anxiety and life stressors AEB pt utilizing relaxation/grounding/deescalating skills, identifying and reframing thoughts that exacerbate anxiety and reporting decreased intensity and frequency of anxiety.  Baseline date 05/29/21: Progress towards goal 0; How Often - Daily Target Date Goal Was reviewed Status Code Progress towards goal  05/29/22  This plan has been reviewed and created by the following participants:  This plan will be reviewed at least every 12 months. Date Behavioral Health Clinician Date  Guardian/Patient   05/29/21  Forde Radon, [facility]        05/29/21 Verbal Consent provided.          Forde Radon, [facility]

## 2022-01-02 ENCOUNTER — Ambulatory Visit: Payer: 59 | Admitting: Psychology

## 2022-01-15 ENCOUNTER — Encounter: Payer: Self-pay | Admitting: Family Medicine

## 2022-01-15 ENCOUNTER — Ambulatory Visit: Payer: 59 | Admitting: Family Medicine

## 2022-01-15 DIAGNOSIS — F988 Other specified behavioral and emotional disorders with onset usually occurring in childhood and adolescence: Secondary | ICD-10-CM | POA: Diagnosis not present

## 2022-01-15 DIAGNOSIS — F17200 Nicotine dependence, unspecified, uncomplicated: Secondary | ICD-10-CM | POA: Diagnosis not present

## 2022-01-15 DIAGNOSIS — B9789 Other viral agents as the cause of diseases classified elsewhere: Secondary | ICD-10-CM | POA: Diagnosis not present

## 2022-01-15 DIAGNOSIS — J019 Acute sinusitis, unspecified: Secondary | ICD-10-CM | POA: Diagnosis not present

## 2022-01-15 MED ORDER — AZELASTINE HCL 0.1 % NA SOLN: 1.0000 | Freq: Two times a day (BID) | NASAL | 0 refills | Status: DC

## 2022-01-15 MED ORDER — LISDEXAMFETAMINE DIMESYLATE 20 MG PO CAPS: 20.0000 mg | ORAL_CAPSULE | Freq: Every day | ORAL | 0 refills | Status: DC

## 2022-01-15 NOTE — Progress Notes (Signed)
   Don Howell is a 31 y.o. male who presents today for an office visit.  He is transfering care to use for primary care. His previous PCP no longer works at this office.   Assessment/Plan:  New/Acute Problems: Sinusitis No red flags.  No signs of systemic illness.  Likely viral URI.  Start Astelin nasal spray.  He will let me know if not improving.  Elevated BP Reading Elevated today though he has typically been very well controlled.  Home readings have been normal as well.  May be elevated due to recent URI symptoms.  We will be starting low-dose Vyvanse as below which should not significantly impact blood pressure readings however he will continue to monitor at home.  He will follow-up with me in a few weeks.  If blood pressure is elevated at follow-up visit can can sitter starting antihypertensive.  Chronic Problems Addressed Today: ADD (attention deficit disorder) without hyperactivity Database with reviewed without red flags.  Able to see records from previous PCPs although back to 2012.  Symptoms are not currently controlled.  We discussed treatment options.  He did have some mood changes with Adderall.  We will avoid this for now.  We will try Vyvanse 20 mg daily.  We discussed potential side effects.  He will follow-up me in a few weeks via MyChart.  Follow-up for an in person office visit in 1 month.   Nicotine dependence with current use Currently vape user.  Recommended cessation.  He is working on this.  Flu vaccine deferred for today.    Subjective:  HPI:  See A/p for status chronic conditions.    He has noticed more issues with his ADHD recently. He recently transferred into a new position at work and has noticed his ADHD symptoms have made this transition challenging. He has trouble focusing and staying on task. He has been on adderall XR in the past which has worked well. He did feel like it caused some irritability and he had to stop due to this. He has not been on  any medication for several years.   He has been under a lot of stress recently.  Currently going through separation with his wife.  He has been seeing a counselor which is helped.  He is also been having some cough congestion for the last several days.  Consistent with previous colds.  No specific treatments tried.  No fevers or chills.       Objective:  Physical Exam: BP (!) 155/96   Pulse 78   Temp (!) 97.5 F (36.4 C) (Temporal)   Ht 6\' 1"  (1.854 m)   Wt 233 lb 3.2 oz (105.8 kg)   BMI 30.77 kg/m   Gen: No acute distress, resting comfortably CV: Regular rate and rhythm with no murmurs appreciated Pulm: Normal work of breathing, clear to auscultation bilaterally with no crackles, wheezes, or rhonchi Neuro: Grossly normal, moves all extremities Psych: Normal affect and thought content      Don Howell M. Jerline Pain, MD 01/15/2022 11:38 AM

## 2022-01-15 NOTE — Assessment & Plan Note (Signed)
Database with reviewed without red flags.  Able to see records from previous PCPs although back to 2012.  Symptoms are not currently controlled.  We discussed treatment options.  He did have some mood changes with Adderall.  We will avoid this for now.  We will try Vyvanse 20 mg daily.  We discussed potential side effects.  He will follow-up me in a few weeks via MyChart.  Follow-up for an in person office visit in 1 month.

## 2022-01-15 NOTE — Patient Instructions (Signed)
It was very nice to see you today!  We will try Vyvanse.  Please send a message in a few weeks limit how you doing.  I will see back in 1 month for follow-up visit.  Please come back sooner if needed.  Take care, Dr Jerline Pain  PLEASE NOTE:  If you had any lab tests please let us know if you have not heard back within a few days. You may see your results on mychart before we have a chance to review them but we will give you a call once they are reviewed by [place]. If we ordered any referrals today, please let us know if you have not heard from their office within the next week.   Please try these tips to maintain a healthy lifestyle:  Eat at least 3 REAL meals and 1-2 snacks per day.  Aim for no more than 5 hours between eating.  If you eat breakfast, please do so within one hour of getting up.   Each meal should contain half fruits/vegetables, one quarter protein, and one quarter carbs (no bigger than a computer mouse)  Cut down on sweet beverages. This includes juice, soda, and sweet tea.   Drink at least 1 glass of water with each meal and aim for at least 8 glasses per day  Exercise at least 150 minutes every week.

## 2022-01-15 NOTE — Assessment & Plan Note (Signed)
Currently vape user.  Recommended cessation.  He is working on this.

## 2022-01-16 ENCOUNTER — Ambulatory Visit: Payer: 59 | Admitting: Psychology

## 2022-02-15 ENCOUNTER — Ambulatory Visit: Payer: 59 | Admitting: Family Medicine

## 2022-02-19 ENCOUNTER — Ambulatory Visit: Payer: 59 | Admitting: Family Medicine

## 2022-02-19 VITALS — BP 136/81 | HR 88 | Temp 97.8°F | Ht 73.0 in | Wt 228.0 lb

## 2022-02-19 DIAGNOSIS — Z23 Encounter for immunization: Secondary | ICD-10-CM

## 2022-02-19 DIAGNOSIS — F411 Generalized anxiety disorder: Secondary | ICD-10-CM | POA: Diagnosis not present

## 2022-02-19 DIAGNOSIS — F988 Other specified behavioral and emotional disorders with onset usually occurring in childhood and adolescence: Secondary | ICD-10-CM | POA: Diagnosis not present

## 2022-02-19 MED ORDER — LISDEXAMFETAMINE DIMESYLATE 20 MG PO CAPS: 20.0000 mg | ORAL_CAPSULE | Freq: Every day | ORAL | 0 refills | Status: DC

## 2022-02-19 NOTE — Progress Notes (Signed)
   Don Howell is a 31 y.o. male who presents today for an office visit.  Assessment/Plan:  Chronic Problems Addressed Today: ADD (attention deficit disorder) without hyperactivity He is doing well Vyvanse 20 mg daily.  Tolerating well without any significant side effects.  Medications are helping him stay focused and on task.  We will continue with current regimen for now.  Follow-up in 3 to 6 months for medication recheck.  Controlled substance agreement signed and scanned in the chart today.  Generalized anxiety disorder Follows with therapy however this is doing much better since starting the Vyvanse.  Flu shot given today.     Subjective:  HPI:  See A/p for status of chronic conditions.   He is here today for medication follow up.  We saw him about a month ago.  Started him on Vyvanse for ADHD.  He is done very well with this.  Medications help with ability stay focused on task.  No significant side effects.       Objective:  Physical Exam: BP 136/81   Pulse 88   Temp 97.8 F (36.6 C) (Temporal)   Ht 6\' 1"  (1.854 m)   Wt 228 lb (103.4 kg)   SpO2 98%   BMI 30.08 kg/m   Gen: No acute distress, resting comfortably CV: Regular rate and rhythm with no murmurs appreciated Pulm: Normal work of breathing, clear to auscultation bilaterally with no crackles, wheezes, or rhonchi Neuro: Grossly normal, moves all extremities Psych: Normal affect and thought content      Don Howell M. Jerline Pain, MD 02/19/2022 12:01 PM

## 2022-02-19 NOTE — Patient Instructions (Signed)
It was very nice to see you today!  I am glad the Vyvanse is working well for you.    We will give your flu shot today.  We will refill this today.  Come back in 6 months for your annual physical.  Come back sooner if needed.  Take care, Dr Jerline Pain  PLEASE NOTE:  If you had any lab tests please let us know if you have not heard back within a few days. You may see your results on mychart before we have a chance to review them but we will give you a call once they are reviewed by [place]. If we ordered any referrals today, please let us know if you have not heard from their office within the next week.   Please try these tips to maintain a healthy lifestyle:  Eat at least 3 REAL meals and 1-2 snacks per day.  Aim for no more than 5 hours between eating.  If you eat breakfast, please do so within one hour of getting up.   Each meal should contain half fruits/vegetables, one quarter protein, and one quarter carbs (no bigger than a computer mouse)  Cut down on sweet beverages. This includes juice, soda, and sweet tea.   Drink at least 1 glass of water with each meal and aim for at least 8 glasses per day  Exercise at least 150 minutes every week.

## 2022-02-19 NOTE — Assessment & Plan Note (Signed)
He is doing well Vyvanse 20 mg daily.  Tolerating well without any significant side effects.  Medications are helping him stay focused and on task.  We will continue with current regimen for now.  Follow-up in 3 to 6 months for medication recheck.  Controlled substance agreement signed and scanned in the chart today.

## 2022-02-19 NOTE — Assessment & Plan Note (Signed)
Follows with therapy however this is doing much better since starting the Vyvanse.

## 2022-02-21 ENCOUNTER — Telehealth: Payer: Self-pay | Admitting: Family Medicine

## 2022-02-21 MED ORDER — LISDEXAMFETAMINE DIMESYLATE 20 MG PO CAPS: 20.0000 mg | ORAL_CAPSULE | Freq: Every day | ORAL | 0 refills | Status: DC

## 2022-02-21 NOTE — Telephone Encounter (Signed)
Patient states RX for lisdexamfetamine (VYVANSE) 20 MG capsule Was sent to the wrong Pharmacy.   Patient states his Preferred Pharmacy is listed below (does not use the other [facility])   Patient requests RX be sent to   [id number] - [place], [place] - [address] AT [facility] OF [facility] [address] Phone: [phone]  Fax: [phone]

## 2022-03-25 ENCOUNTER — Other Ambulatory Visit: Payer: Self-pay | Admitting: Family Medicine

## 2022-03-25 NOTE — Telephone Encounter (Signed)
Pt States: -Pharmacy states they do not have any Prescription available for patient.  Pt requests: -Refill     LAST APPOINTMENT DATE:   02/19/22 OV with PCP   NEXT APPOINTMENT DATE: 05/17/22 OV with PCP   MEDICATION: lisdexamfetamine (VYVANSE) 20 MG capsule [[id number]]   Is the patient out of medication?  Yes, has one left   PHARMACY: [facility] #[id number] [place], [place] - [address] AT [facility] OF [facility] [address] [place], [place] [address] Phone: [phone]  Fax: [phone] DEA #: [id number]

## 2022-03-26 MED ORDER — LISDEXAMFETAMINE DIMESYLATE 20 MG PO CAPS: 20.0000 mg | ORAL_CAPSULE | Freq: Every day | ORAL | 0 refills | Status: DC

## 2022-05-17 ENCOUNTER — Ambulatory Visit (INDEPENDENT_AMBULATORY_CARE_PROVIDER_SITE_OTHER): Payer: 59 | Admitting: Family Medicine

## 2022-05-17 ENCOUNTER — Encounter: Payer: Self-pay | Admitting: Family Medicine

## 2022-05-17 VITALS — BP 133/90 | HR 73 | Temp 97.5°F | Ht 73.0 in | Wt 229.0 lb

## 2022-05-17 DIAGNOSIS — F17200 Nicotine dependence, unspecified, uncomplicated: Secondary | ICD-10-CM | POA: Diagnosis not present

## 2022-05-17 DIAGNOSIS — F411 Generalized anxiety disorder: Secondary | ICD-10-CM

## 2022-05-17 DIAGNOSIS — F988 Other specified behavioral and emotional disorders with onset usually occurring in childhood and adolescence: Secondary | ICD-10-CM

## 2022-05-17 DIAGNOSIS — R739 Hyperglycemia, unspecified: Secondary | ICD-10-CM

## 2022-05-17 DIAGNOSIS — Z13220 Encounter for screening for lipoid disorders: Secondary | ICD-10-CM

## 2022-05-17 DIAGNOSIS — Z23 Encounter for immunization: Secondary | ICD-10-CM

## 2022-05-17 DIAGNOSIS — Z0001 Encounter for general adult medical examination with abnormal findings: Secondary | ICD-10-CM

## 2022-05-17 LAB — LIPID PANEL
Cholesterol: 190 mg/dL (ref 0–200)
HDL: 59.8 mg/dL (ref >39.00)
LDL Cholesterol: 106 mg/dL — ABNORMAL HIGH (ref 0–99)
NonHDL: 130.13
Total CHOL/HDL Ratio: 3
Triglycerides: 121 mg/dL (ref 0.0–149.0)
VLDL: 24.2 mg/dL (ref 0.0–40.0)

## 2022-05-17 LAB — COMPREHENSIVE METABOLIC PANEL
ALT: 21 U/L (ref 0–53)
AST: 14 U/L (ref 0–37)
Albumin: 4 g/dL (ref 3.5–5.2)
Alkaline Phosphatase: 64 U/L (ref 39–117)
BUN: 13 mg/dL (ref 6–23)
CO2: 31 mEq/L (ref 19–32)
Calcium: 9.3 mg/dL (ref 8.4–10.5)
Chloride: 102 mEq/L (ref 96–112)
Creatinine, Ser: 1.11 mg/dL (ref 0.40–1.50)
GFR: 88.52 mL/min (ref >60.00)
Glucose, Bld: 101 mg/dL — ABNORMAL HIGH (ref 70–99)
Potassium: 4.1 mEq/L (ref 3.5–5.1)
Sodium: 140 mEq/L (ref 135–145)
Total Bilirubin: 0.4 mg/dL (ref 0.2–1.2)
Total Protein: 6.3 g/dL (ref 6.0–8.3)

## 2022-05-17 LAB — TSH: TSH: 2.5 u[IU]/mL (ref 0.35–5.50)

## 2022-05-17 LAB — CBC
HCT: 48.1 % (ref 39.0–52.0)
Hemoglobin: 16.3 g/dL (ref 13.0–17.0)
MCHC: 33.8 g/dL (ref 30.0–36.0)
MCV: 86.4 fl (ref 78.0–100.0)
Platelets: 224 10*3/uL (ref 150.0–400.0)
RBC: 5.57 Mil/uL (ref 4.22–5.81)
RDW: 12.5 % (ref 11.5–15.5)
WBC: 6.4 10*3/uL (ref 4.0–10.5)

## 2022-05-17 LAB — HEMOGLOBIN A1C: Hgb A1c MFr Bld: 5.3 % (ref 4.6–6.5)

## 2022-05-17 MED ORDER — LISDEXAMFETAMINE DIMESYLATE 20 MG PO CAPS: 20.0000 mg | ORAL_CAPSULE | Freq: Every day | ORAL | 0 refills | Status: DC

## 2022-05-17 MED ORDER — CITALOPRAM HYDROBROMIDE 20 MG PO TABS: 20.0000 mg | ORAL_TABLET | Freq: Every day | ORAL | 3 refills | Status: DC

## 2022-05-17 NOTE — Addendum Note (Signed)
Addended by: Betti Cruz on: 05/17/2022 12:15 PM   Modules accepted: Orders

## 2022-05-17 NOTE — Assessment & Plan Note (Addendum)
Symptoms are a little worse than our last visit.  We discussed treatment options.  He is agreeable to restart medications.  He does not remember what worked well for him in the past.  Will start Celexa 20 mg daily.  Discussed potential side effects.  He will follow-up with me in a couple of weeks via MyChart.

## 2022-05-17 NOTE — Progress Notes (Signed)
Chief Complaint:  Don Howell is a 32 y.o. male who presents today for his annual comprehensive physical exam.    Assessment/Plan:  Chronic Problems Addressed Today: ADD (attention deficit disorder) without hyperactivity Stable on Vyvanse 20 mg daily.  Medications help with ability stay focused on task.  He does notice he has little more difficulty with sleeping with medication however this is overall manageable.  No severe mood changes with Vyvanse.  No palpitations.  We will refill today.  He will come back in 6 months for medication recheck.  Nicotine dependence with current use He is working on weaning down.  Currently using vape.  We did discuss medications to help with cessation however declined for now.  Generalized anxiety disorder Symptoms are a little worse than our last visit.  We discussed treatment options.  He is agreeable to restart medications.  He does not remember what worked well for him in the past.  Will start Celexa 20 mg daily.  Discussed potential side effects.  He will follow-up with me in a couple of weeks via MyChart.  Preventative Healthcare: Tdap given today.  Check labs.  Patient Counseling(The following topics were reviewed and/or handout was given):  -Nutrition: Stressed importance of moderation in sodium/caffeine intake, saturated fat and cholesterol, caloric balance, sufficient intake of fresh fruits, vegetables, and fiber.  -Stressed the importance of regular exercise.   -Substance Abuse: Discussed cessation/primary prevention of tobacco, alcohol, or other drug use; driving or other dangerous activities under the influence; availability of treatment for abuse.   -Injury prevention: Discussed safety belts, safety helmets, smoke detector, smoking near bedding or upholstery.   -Sexuality: Discussed sexually transmitted diseases, partner selection, use of condoms, avoidance of unintended pregnancy and contraceptive alternatives.   -Dental health:  Discussed importance of regular tooth brushing, flossing, and dental visits.  -Health maintenance and immunizations reviewed. Please refer to Health maintenance section.  Return to care in 1 year for next preventative visit.     Subjective:  HPI:  He has no acute complaints today.   See A/P for status of chronic conditions.   Does note that his anxiety seems to be a little bit worse since our last visit.  He has not seen his therapist in a while.  No reported SI or HI.  He has been on medication in the past for anxiety including as needed medications and daily medications.  Does not remember the name of the medications.  He does think that they were effective in the past.  Lifestyle Diet: None specific.  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 Does a lot of heavy lifting.      05/17/2022    8:48 AM  Depression screen PHQ 2/9  Decreased Interest 0  Down, Depressed, Hopeless 0  PHQ - 2 Score 0    Health Maintenance Due  Topic Date Due   DTaP/Tdap/Td (5 - Td or Tdap) 06/04/2020     ROS: Per HPI, otherwise a complete review of systems was negative.   PMH:  The following were reviewed and entered/updated in epic: Past Medical History:  Diagnosis Date   ADD (attention deficit disorder)    Depression    Patient Active Problem List   Diagnosis Date Noted   Generalized anxiety disorder 02/19/2022   Nicotine dependence with current use 01/15/2022   ADD (attention deficit disorder) without hyperactivity 04/12/2019   Past Surgical History:  Procedure Laterality Date   VASECTOMY  11/2019   [facility]  History reviewed. No pertinent family history.  Medications- reviewed and updated Current Outpatient Medications  Medication Sig Dispense Refill   citalopram (CELEXA) 20 MG tablet Take 1 tablet (20 mg total) by mouth daily. 30 tablet 3   lisdexamfetamine (VYVANSE) 20 MG capsule Take 1 capsule (20 mg total) by mouth daily. 30 capsule 0   [START ON  07/16/2022] lisdexamfetamine (VYVANSE) 20 MG capsule Take 1 capsule (20 mg total) by mouth daily. 30 capsule 0   [START ON 06/16/2022] lisdexamfetamine (VYVANSE) 20 MG capsule Take 1 capsule (20 mg total) by mouth daily. 30 capsule 0   No current facility-administered medications for this visit.    Allergies-reviewed and updated No Known Allergies  Social History   Socioeconomic History   Marital status: Single    Spouse name: Not on file   Number of children: Not on file   Years of education: Not on file   Highest education level: Not on file  Occupational History   Not on file  Tobacco Use   Smoking status: Never   Smokeless tobacco: Never  Vaping Use   Vaping Use: Former  Substance and Sexual Activity   Alcohol use: Yes   Drug use: Never   Sexual activity: Yes  Other Topics Concern   Not on file  Social History Narrative   Not on file   Social Determinants of Health   Financial Resource Strain: Not on file  Food Insecurity: Not on file  Transportation Needs: Not on file  Physical Activity: Not on file  Stress: Not on file  Social Connections: Not on file        Objective:  Physical Exam: BP (!) 133/90   Pulse 73   Temp (!) 97.5 F (36.4 C) (Temporal)   Ht 6\' 1"  (1.854 m)   Wt 229 lb (103.9 kg)   SpO2 97%   BMI 30.21 kg/m   Body mass index is 30.21 kg/m. Wt Readings from Last 3 Encounters:  05/17/22 229 lb (103.9 kg)  02/19/22 228 lb (103.4 kg)  01/15/22 233 lb 3.2 oz (105.8 kg)   Gen: NAD, resting comfortably HEENT: TMs normal bilaterally. OP clear. No thyromegaly noted.  CV: RRR with no murmurs appreciated Pulm: NWOB, CTAB with no crackles, wheezes, or rhonchi GI: Normal bowel sounds present. Soft, Nontender, Nondistended. MSK: no edema, cyanosis, or clubbing noted Skin: warm, dry Neuro: CN2-12 grossly intact. Strength 5/5 in upper and lower extremities. Reflexes symmetric and intact bilaterally.  Psych: Normal affect and thought content      Naydeen Speirs M. Jerline Pain, MD 05/17/2022 9:23 AM

## 2022-05-17 NOTE — Patient Instructions (Signed)
It was very nice to see you today!  Will check blood work today.  We gave your tetanus shot today.  Please start the Celexa.  Sending message in a few weeks to let me know how this is working.  Will see back in 6 months for medication check.  Come back sooner if needed.  Take care, Dr Jerline Pain  PLEASE NOTE:  If you had any lab tests, please let us know if you have not heard back within a few days. You may see your results on mychart before we have a chance to review them but we will give you a call once they are reviewed by [place].   If we ordered any referrals today, please let us know if you have not heard from their office within the next week.   If you had any urgent prescriptions sent in today, please check with the pharmacy within an hour of our visit to make sure the prescription was transmitted appropriately.   Please try these tips to maintain a healthy lifestyle:  Eat at least 3 REAL meals and 1-2 snacks per day.  Aim for no more than 5 hours between eating.  If you eat breakfast, please do so within one hour of getting up.   Each meal should contain half fruits/vegetables, one quarter protein, and one quarter carbs (no bigger than a computer mouse)  Cut down on sweet beverages. This includes juice, soda, and sweet tea.   Drink at least 1 glass of water with each meal and aim for at least 8 glasses per day  Exercise at least 150 minutes every week.    Preventive Care 67-80 Years Old, Male Preventive care refers to lifestyle choices and visits with your health care provider that can promote health and wellness. Preventive care visits are also called wellness exams. What can I expect for my preventive care visit? Counseling During your preventive care visit, your health care provider may ask about your: Medical history, including: Past medical problems. Family medical history. Current health, including: Emotional well-being. Home life and relationship well-being. Sexual  activity. Lifestyle, including: Alcohol, nicotine or tobacco, and drug use. Access to firearms. Diet, exercise, and sleep habits. Safety issues such as seatbelt and bike helmet use. Sunscreen use. Work and work Statistician. Physical exam Your health care provider may check your: Height and weight. These may be used to calculate your BMI (body mass index). BMI is a measurement that tells if you are at a healthy weight. Waist circumference. This measures the distance around your waistline. This measurement also tells if you are at a healthy weight and may help predict your risk of certain diseases, such as type 2 diabetes and high blood pressure. Heart rate and blood pressure. Body temperature. Skin for abnormal spots. What immunizations do I need?  Vaccines are usually given at various ages, according to a schedule. Your health care provider will recommend vaccines for you based on your age, medical history, and lifestyle or other factors, such as travel or where you work. What tests do I need? Screening Your health care provider may recommend screening tests for certain conditions. This may include: Lipid and cholesterol levels. Diabetes screening. This is done by checking your blood sugar (glucose) after you have not eaten for a while (fasting). Hepatitis B test. Hepatitis C test. HIV (human immunodeficiency virus) test. STI (sexually transmitted infection) testing, if you are at risk. Talk with your health care provider about your test results, treatment options, and if necessary,  the need for more tests. Follow these instructions at home: Eating and drinking  Eat a healthy diet that includes fresh fruits and vegetables, whole grains, lean protein, and low-fat dairy products. Drink enough fluid to keep your urine pale yellow. Take vitamin and mineral supplements as recommended by your health care provider. Do not drink alcohol if your health care provider tells you not to  drink. If you drink alcohol: Limit how much you have to 0-2 drinks a day. Know how much alcohol is in your drink. In the [place] one drink equals one 12 oz bottle of beer (355 mL), one 5 oz glass of wine (148 mL), or one 1 oz glass of hard liquor (44 mL). Lifestyle Brush your teeth every morning and night with fluoride toothpaste. Floss one time each day. Exercise for at least 30 minutes 5 or more days each week. Do not use any products that contain nicotine or tobacco. These products include cigarettes, chewing tobacco, and vaping devices, such as e-cigarettes. If you need help quitting, ask your health care provider. Do not use drugs. If you are sexually active, practice safe sex. Use a condom or other form of protection to prevent STIs. Find healthy ways to manage stress, such as: Meditation, yoga, or listening to music. Journaling. Talking to a trusted person. Spending time with friends and family. Minimize exposure to UV radiation to reduce your risk of skin cancer. Safety Always wear your seat belt while driving or riding in a vehicle. Do not drive: If you have been drinking alcohol. Do not ride with someone who has been drinking. If you have been using any mind-altering substances or drugs. While texting. When you are tired or distracted. Wear a helmet and other protective equipment during sports activities. If you have firearms in your house, make sure you follow all gun safety procedures. Seek help if you have been physically or sexually abused. What's next? Go to your health care provider once a year for an annual wellness visit. Ask your health care provider how often you should have your eyes and teeth checked. Stay up to date on all vaccines. This information is not intended to replace advice given to you by your health care provider. Make sure you discuss any questions you have with your health care provider. Document Revised: 09/27/2020 Document Reviewed:  09/27/2020 Elsevier Patient Education  [employer].

## 2022-05-17 NOTE — Assessment & Plan Note (Signed)
Stable on Vyvanse 20 mg daily.  Medications help with ability stay focused on task.  He does notice he has little more difficulty with sleeping with medication however this is overall manageable.  No severe mood changes with Vyvanse.  No palpitations.  We will refill today.  He will come back in 6 months for medication recheck.

## 2022-05-17 NOTE — Assessment & Plan Note (Signed)
He is working on weaning down.  Currently using vape.  We did discuss medications to help with cessation however declined for now.

## 2022-05-21 NOTE — Progress Notes (Signed)
Please inform patient of the following:  His "bad" cholesterol is a little bit elevated.  Everything else is stable.  He should continue to work on diet and exercise and we can recheck in a year or so.

## 2022-09-16 ENCOUNTER — Other Ambulatory Visit: Payer: Self-pay | Admitting: Family Medicine

## 2022-12-30 ENCOUNTER — Telehealth: Payer: 59 | Admitting: Physician Assistant

## 2022-12-30 DIAGNOSIS — U071 COVID-19: Secondary | ICD-10-CM

## 2022-12-30 MED ORDER — PROMETHAZINE-DM 6.25-15 MG/5ML PO SYRP: 5.0000 mL | ORAL_SOLUTION | Freq: Four times a day (QID) | ORAL | 0 refills | Status: DC | PRN

## 2022-12-30 NOTE — Progress Notes (Signed)
E-Visit  for Positive Covid Test Result   We are sorry you are not feeling well. We are here to help!  You have tested positive for COVID-19, meaning that you were infected with the novel coronavirus and could give the virus to others.  Most people with COVID-19 have mild illness and can recover at home without medical care. Do not leave your home, except to get medical care. Do not visit public areas and do not go to places where you are unable to wear a mask. It is important that you stay home  to take care for yourself and to help protect other people in your home and community.      Isolation Instructions:   You are to isolate at home until you have been fever free for at least 24 hours without a fever-reducing medication, and symptoms have been steadily improving for 24 hours. At that time,  you can end isolation but need to mask for an additional 5 days.  If you must be around other household members who do not have symptoms, you need to make sure that both you and the family members are masking consistently with a high-quality mask.  If you note any worsening of symptoms despite treatment, please seek an in-person evaluation ASAP. If you note any significant shortness of breath or any chest pain, please seek ER evaluation. Please do not delay care!   Go to the nearest hospital ED for assessment if fever/cough/breathlessness are severe or illness seems like a threat to life.    The following symptoms may appear 2-14 days after exposure: Fever Cough Shortness of breath or difficulty breathing Chills Repeated shaking with chills Muscle pain Headache Sore throat New loss of taste or smell Fatigue Congestion or runny nose Nausea or vomiting Diarrhea  You can use medication such as prescription cough medication called Phenergan DM 6.25 mg/15 mg. You make take one teaspoon / 5 ml every 4-6 hours as needed for cough  You may also take acetaminophen (Tylenol) as needed for  fever.  HOME CARE: Only take medications as instructed by your medical team. Drink plenty of fluids and get plenty of rest. A steam or ultrasonic humidifier can help if you have congestion.   GET HELP RIGHT AWAY IF YOU HAVE EMERGENCY WARNING SIGNS.  Call 911 or proceed to your closest emergency facility if: You develop worsening high fever. Trouble breathing Bluish lips or face Persistent pain or pressure in the chest New confusion Inability to wake or stay awake You cough up blood. Your symptoms become more severe Inability to hold down food or fluids  This list is not all possible symptoms. Contact your medical provider for any symptoms that are severe or concerning to you.   Your e-visit answers were reviewed by a board certified advanced clinical practitioner to complete your personal care plan.  Depending on the condition, your plan could have included both over the counter or prescription medications.  If there is a problem please reply once you have received a response from your provider.  Your safety is important to [place].  If you have drug allergies check your prescription carefully.    You can use MyChart to ask questions about today's visit, request a non-urgent call back, or ask for a work or school excuse for 24 hours related to this e-Visit. If it has been greater than 24 hours you will need to follow up with your provider, or enter a new e-Visit to address those concerns. You  will get an e-mail in the next two days asking about your experience.  I hope that your e-visit has been valuable and will speed your recovery. Thank you for using e-visits.     I have spent 5 minutes in review of e-visit questionnaire, review and updating patient chart, medical decision making and response to patient.   [name], PA-C

## 2023-03-31 ENCOUNTER — Telehealth: Payer: 59 | Admitting: Physician Assistant

## 2023-03-31 DIAGNOSIS — A084 Viral intestinal infection, unspecified: Secondary | ICD-10-CM

## 2023-03-31 MED ORDER — ONDANSETRON HCL 4 MG PO TABS: 4.0000 mg | ORAL_TABLET | Freq: Three times a day (TID) | ORAL | 0 refills | Status: DC | PRN

## 2023-03-31 NOTE — Progress Notes (Signed)

## 2023-05-30 ENCOUNTER — Encounter: Payer: Self-pay | Admitting: Family Medicine

## 2023-05-30 ENCOUNTER — Ambulatory Visit: Payer: 59 | Admitting: Family Medicine

## 2023-05-30 VITALS — BP 151/95 | HR 93 | Temp 98.3°F | Ht 73.0 in | Wt 234.0 lb

## 2023-05-30 DIAGNOSIS — F988 Other specified behavioral and emotional disorders with onset usually occurring in childhood and adolescence: Secondary | ICD-10-CM | POA: Diagnosis not present

## 2023-05-30 DIAGNOSIS — Z13220 Encounter for screening for lipoid disorders: Secondary | ICD-10-CM | POA: Diagnosis not present

## 2023-05-30 DIAGNOSIS — Z131 Encounter for screening for diabetes mellitus: Secondary | ICD-10-CM

## 2023-05-30 DIAGNOSIS — Z0001 Encounter for general adult medical examination with abnormal findings: Secondary | ICD-10-CM | POA: Diagnosis not present

## 2023-05-30 DIAGNOSIS — F411 Generalized anxiety disorder: Secondary | ICD-10-CM | POA: Diagnosis not present

## 2023-05-30 DIAGNOSIS — I10 Essential (primary) hypertension: Secondary | ICD-10-CM | POA: Diagnosis not present

## 2023-05-30 DIAGNOSIS — Z23 Encounter for immunization: Secondary | ICD-10-CM

## 2023-05-30 LAB — CBC
HCT: 53.2 % — ABNORMAL HIGH (ref 39.0–52.0)
Hemoglobin: 17.8 g/dL — ABNORMAL HIGH (ref 13.0–17.0)
MCHC: 33.5 g/dL (ref 30.0–36.0)
MCV: 86.6 fL (ref 78.0–100.0)
Platelets: 224 10*3/uL (ref 150.0–400.0)
RBC: 6.15 Mil/uL — ABNORMAL HIGH (ref 4.22–5.81)
RDW: 12.3 % (ref 11.5–15.5)
WBC: 6.1 10*3/uL (ref 4.0–10.5)

## 2023-05-30 LAB — COMPREHENSIVE METABOLIC PANEL
ALT: 40 U/L (ref 0–53)
AST: 25 U/L (ref 0–37)
Albumin: 4.4 g/dL (ref 3.5–5.2)
Alkaline Phosphatase: 75 U/L (ref 39–117)
BUN: 16 mg/dL (ref 6–23)
CO2: 26 meq/L (ref 19–32)
Calcium: 9.3 mg/dL (ref 8.4–10.5)
Chloride: 103 meq/L (ref 96–112)
Creatinine, Ser: 1.15 mg/dL (ref 0.40–1.50)
GFR: 84.23 mL/min (ref >60.00)
Glucose, Bld: 96 mg/dL (ref 70–99)
Potassium: 4.1 meq/L (ref 3.5–5.1)
Sodium: 140 meq/L (ref 135–145)
Total Bilirubin: 0.7 mg/dL (ref 0.2–1.2)
Total Protein: 7.2 g/dL (ref 6.0–8.3)

## 2023-05-30 LAB — LIPID PANEL
Cholesterol: 178 mg/dL (ref 0–200)
HDL: 60.5 mg/dL (ref >39.00)
LDL Cholesterol: 101 mg/dL — ABNORMAL HIGH (ref 0–99)
NonHDL: 117.31
Total CHOL/HDL Ratio: 3
Triglycerides: 84 mg/dL (ref 0.0–149.0)
VLDL: 16.8 mg/dL (ref 0.0–40.0)

## 2023-05-30 LAB — TSH: TSH: 1.46 u[IU]/mL (ref 0.35–5.50)

## 2023-05-30 LAB — HEMOGLOBIN A1C: Hgb A1c MFr Bld: 5.3 % (ref 4.6–6.5)

## 2023-05-30 MED ORDER — LISDEXAMFETAMINE DIMESYLATE 20 MG PO CAPS: 20.0000 mg | ORAL_CAPSULE | Freq: Every day | ORAL | 0 refills | Status: DC

## 2023-05-30 MED ORDER — AMLODIPINE BESYLATE 5 MG PO TABS: 5.0000 mg | ORAL_TABLET | Freq: Every day | ORAL | 3 refills | Status: AC

## 2023-05-30 NOTE — Assessment & Plan Note (Signed)
BP not controlled today and has been elevated at home the last few weeks. We will start amlodipine 5 mg daily. He will monitor at home and follow up here in a couple of weeks.

## 2023-05-30 NOTE — Patient Instructions (Addendum)
It was very nice to see you today!  We will check blood work.  We will give you your flu shot.  Please start the amlodipine.   Please restart your celexa and Vyvanse.   No follow-ups on file.   Take care, Dr Jimmey Ralph  PLEASE NOTE:  If you had any lab tests, please let us know if you have not heard back within a few days. You may see your results on mychart before we have a chance to review them but we will give you a call once they are reviewed by [place].   If we ordered any referrals today, please let us know if you have not heard from their office within the next week.   If you had any urgent prescriptions sent in today, please check with the pharmacy within an hour of our visit to make sure the prescription was transmitted appropriately.   Please try these tips to maintain a healthy lifestyle:  Eat at least 3 REAL meals and 1-2 snacks per day.  Aim for no more than 5 hours between eating.  If you eat breakfast, please do so within one hour of getting up.   Each meal should contain half fruits/vegetables, one quarter protein, and one quarter carbs (no bigger than a computer mouse)  Cut down on sweet beverages. This includes juice, soda, and sweet tea.   Drink at least 1 glass of water with each meal and aim for at least 8 glasses per day  Exercise at least 150 minutes every week.     Preventive Care 83-32 Years Old, Male Preventive care refers to lifestyle choices and visits with your health care provider that can promote health and wellness. Preventive care visits are also called wellness exams. What can I expect for my preventive care visit? Counseling During your preventive care visit, your health care provider may ask about your: Medical history, including: Past medical problems. Family medical history. Current health, including: Emotional well-being. Home life and relationship well-being. Sexual activity. Lifestyle, including: Alcohol, nicotine or tobacco, and drug  use. Access to firearms. Diet, exercise, and sleep habits. Safety issues such as seatbelt and bike helmet use. Sunscreen use. Work and work Astronomer. Physical exam Your health care provider may check your: Height and weight. These may be used to calculate your BMI (body mass index). BMI is a measurement that tells if you are at a healthy weight. Waist circumference. This measures the distance around your waistline. This measurement also tells if you are at a healthy weight and may help predict your risk of certain diseases, such as type 2 diabetes and high blood pressure. Heart rate and blood pressure. Body temperature. Skin for abnormal spots. What immunizations do I need?  Vaccines are usually given at various ages, according to a schedule. Your health care provider will recommend vaccines for you based on your age, medical history, and lifestyle or other factors, such as travel or where you work. What tests do I need? Screening Your health care provider may recommend screening tests for certain conditions. This may include: Lipid and cholesterol levels. Diabetes screening. This is done by checking your blood sugar (glucose) after you have not eaten for a while (fasting). Hepatitis B test. Hepatitis C test. HIV (human immunodeficiency virus) test. STI (sexually transmitted infection) testing, if you are at risk. Talk with your health care provider about your test results, treatment options, and if necessary, the need for more tests. Follow these instructions at home: Eating and drinking  Eat a healthy diet that includes fresh fruits and vegetables, whole grains, lean protein, and low-fat dairy products. Drink enough fluid to keep your urine pale yellow. Take vitamin and mineral supplements as recommended by your health care provider. Do not drink alcohol if your health care provider tells you not to drink. If you drink alcohol: Limit how much you have to 0-2 drinks a day. Know  how much alcohol is in your drink. In the [place] one drink equals one 12 oz bottle of beer (355 mL), one 5 oz glass of wine (148 mL), or one 1 oz glass of hard liquor (44 mL). Lifestyle Brush your teeth every morning and night with fluoride toothpaste. Floss one time each day. Exercise for at least 30 minutes 5 or more days each week. Do not use any products that contain nicotine or tobacco. These products include cigarettes, chewing tobacco, and vaping devices, such as e-cigarettes. If you need help quitting, ask your health care provider. Do not use drugs. If you are sexually active, practice safe sex. Use a condom or other form of protection to prevent STIs. Find healthy ways to manage stress, such as: Meditation, yoga, or listening to music. Journaling. Talking to a trusted person. Spending time with friends and family. Minimize exposure to UV radiation to reduce your risk of skin cancer. Safety Always wear your seat belt while driving or riding in a vehicle. Do not drive: If you have been drinking alcohol. Do not ride with someone who has been drinking. If you have been using any mind-altering substances or drugs. While texting. When you are tired or distracted. Wear a helmet and other protective equipment during sports activities. If you have firearms in your house, make sure you follow all gun safety procedures. Seek help if you have been physically or sexually abused. What's next? Go to your health care provider once a year for an annual wellness visit. Ask your health care provider how often you should have your eyes and teeth checked. Stay up to date on all vaccines. This information is not intended to replace advice given to you by your health care provider. Make sure you discuss any questions you have with your health care provider. Document Revised: 09/27/2020 Document Reviewed: 09/27/2020 Elsevier Patient Education  2024 [employer].

## 2023-05-30 NOTE — Assessment & Plan Note (Signed)
On Vyvanse 20 mg daily though has had some issue with getting this at the pharmacy. He would like to restart. Database wqas without red flags. He has done well with Vyvanse in the past. It has helped significantly with his ability to stay focused and on task. We will refill today. Do not anticipate that this will increase his BP however he will follow up here in a couple of weeks for recheck.

## 2023-05-30 NOTE — Progress Notes (Signed)
Chief Complaint:  [facility] is a 33 y.o. male who presents today for his annual comprehensive physical exam.    Assessment/Plan:  Chronic Problems Addressed Today: Essential hypertension BP not controlled today and has been elevated at home the last few weeks. We will start amlodipine 5 mg daily. He will monitor at home and follow up here in a couple of weeks.   Generalized anxiety disorder Not controlled. He has been off medications for awhile. No SI or HI. This could be causing some of his recent BP elevations as well. We will restart his celexa 20 mg daily. He has done well with this in the past. He will follow up in a couple of weeks.  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 He would like to restart. Database wqas without red flags. He has done well with Vyvanse in the past. It has helped significantly with his ability to stay focused and on task. We will refill today. Do not anticipate that this will increase his BP however he will follow up here in a couple of weeks for recheck.    Preventative Healthcare: Flu vaccine given today. Check labs.   Patient Counseling(The following topics were reviewed and/or handout was given):  -Nutrition: Stressed importance of moderation in sodium/caffeine intake, saturated fat and cholesterol, caloric balance, sufficient intake of fresh fruits, vegetables, and fiber.  -Stressed the importance of regular exercise.   -Substance Abuse: Discussed cessation/primary prevention of tobacco, alcohol, or other drug use; driving or other dangerous activities under the influence; availability of treatment for abuse.   -Injury prevention: Discussed safety belts, safety helmets, smoke detector, smoking near bedding or upholstery.   -Sexuality: Discussed sexually transmitted diseases, partner selection, use of condoms, avoidance of unintended pregnancy and  contraceptive alternatives.   -Dental health: Discussed importance of regular tooth brushing, flossing, and dental visits.  -Health maintenance and immunizations reviewed. Please refer to Health maintenance section.  Return to care in 1 year for next preventative visit.     Subjective:  HPI:  He has no acute complaints today. See Assessment / plan for status of chronic conditions.   He is worried about elevated blood pressure readings. He started monitoring at home the last couple of weeks and numbers have been consistently in the 150's/100s. He has been more anxious lately. At our last visit we prescribed Celexa.  He did feel like this helped with his anxiety however he stopped this a while ago as he felt like he did not need it any longer.  He has had worsening anxiety the last several weeks though does attribute this to being worried about his blood pressure being elevated at home.  He has also had difficulty getting his Vyvanse filled at the pharmacy. He does feel like this helps with his ability to stay focusedand on task when he is able to get it at that pharmacy.   Lifestyle Diet: Cutting down on sodium. Trying more fruits and vegetables.  Exercise: None specific. Very active with work.      05/17/2022    8:48 AM  Depression screen PHQ 2/9  Decreased Interest 0  Down, Depressed, Hopeless 0  PHQ - 2 Score 0    Health Maintenance Due  Topic Date Due   Hepatitis C Screening  Never done   INFLUENZA VACCINE  11/14/2022     ROS: Per HPI, otherwise a complete review of systems was  negative.   PMH:  The following were reviewed and entered/updated in epic: Past Medical History:  Diagnosis Date   ADD (attention deficit disorder)    Depression    Patient Active Problem List   Diagnosis Date Noted   Essential hypertension 05/30/2023   Generalized anxiety disorder 02/19/2022   Nicotine dependence with current use 01/15/2022   ADD (attention deficit disorder) without  hyperactivity 04/12/2019   Past Surgical History:  Procedure Laterality Date   VASECTOMY  11/2019   [facility]    History reviewed. No pertinent family history.  Medications- reviewed and updated Current Outpatient Medications  Medication Sig Dispense Refill   amLODipine (NORVASC) 5 MG tablet Take 1 tablet (5 mg total) by mouth daily. 90 tablet 3   citalopram (CELEXA) 20 MG tablet TAKE 1 TABLET(20 MG) BY MOUTH DAILY (Patient not taking: Reported on 05/30/2023) 30 tablet 3   lisdexamfetamine (VYVANSE) 20 MG capsule Take 1 capsule (20 mg total) by mouth daily. 30 capsule 0   No current facility-administered medications for this visit.    Allergies-reviewed and updated No Known Allergies  Social History   Socioeconomic History   Marital status: Single    Spouse name: Not on file   Number of children: Not on file   Years of education: Not on file   Highest education level: Not on file  Occupational History   Not on file  Tobacco Use   Smoking status: Never   Smokeless tobacco: Never  Vaping Use   Vaping status: Former  Substance and Sexual Activity   Alcohol use: Yes   Drug use: Never   Sexual activity: Yes  Other Topics Concern   Not on file  Social History Narrative   Not on file   Social Drivers of Health   Financial Resource Strain: Not on file  Food Insecurity: Not on file  Transportation Needs: Not on file  Physical Activity: Not on file  Stress: Not on file  Social Connections: Unknown (08/21/2021)   Received from [facility]   Social Network    Social Network: Not on file        Objective:  Physical Exam: BP (!) 151/95   Pulse 93   Temp 98.3 F (36.8 C) (Temporal)   Ht 6\' 1"  (1.854 m)   Wt 234 lb (106.1 kg)   SpO2 98%   BMI 30.87 kg/m   Body mass index is 30.87 kg/m. Wt Readings from Last 3 Encounters:  05/30/23 234 lb (106.1 kg)  05/17/22 229 lb (103.9 kg)  02/19/22 228 lb (103.4 kg)   Gen: NAD, resting  comfortably HEENT: TMs normal bilaterally. OP clear. No thyromegaly noted.  CV: RRR with no murmurs appreciated Pulm: NWOB, CTAB with no crackles, wheezes, or rhonchi GI: Normal bowel sounds present. Soft, Nontender, Nondistended. MSK: no edema, cyanosis, or clubbing noted Skin: warm, dry Neuro: CN2-12 grossly intact. Strength 5/5 in upper and lower extremities. Reflexes symmetric and intact bilaterally.  Psych: Normal affect and thought content     Napoleon Monacelli M. Jimmey Ralph, MD 05/30/2023 11:01 AM

## 2023-05-30 NOTE — Assessment & Plan Note (Signed)
Not controlled. He has been off medications for awhile. No SI or HI. This could be causing some of his recent BP elevations as well. We will restart his celexa 20 mg daily. He has done well with this in the past. He will follow up in a couple of weeks.

## 2023-06-03 ENCOUNTER — Encounter: Payer: Self-pay | Admitting: Family Medicine

## 2023-06-03 NOTE — Progress Notes (Signed)
 Cholesterol is borderline but stable compared to last year.  We can recheck it again in a year or so.  He should work on diet and exercise.  Has blood counts are up a little bit.  Can we have him come back to recheck CBC in a few weeks?  Everything else is at goal.  We can recheck in a year.

## 2023-06-04 ENCOUNTER — Other Ambulatory Visit: Payer: Self-pay | Admitting: *Deleted

## 2023-06-04 DIAGNOSIS — R7989 Other specified abnormal findings of blood chemistry: Secondary | ICD-10-CM

## 2023-06-10 ENCOUNTER — Ambulatory Visit: Payer: 59 | Admitting: Family Medicine

## 2023-06-13 ENCOUNTER — Encounter (HOSPITAL_COMMUNITY): Payer: Self-pay

## 2023-06-13 ENCOUNTER — Emergency Department (HOSPITAL_COMMUNITY): Payer: 59

## 2023-06-13 ENCOUNTER — Encounter: Payer: Self-pay | Admitting: Internal Medicine

## 2023-06-13 ENCOUNTER — Emergency Department (HOSPITAL_COMMUNITY)
Admission: EM | Admit: 2023-06-13 | Discharge: 2023-06-13 | Disposition: A | Discharge status: Home / Self Care | Payer: 59 | Attending: Emergency Medicine | Admitting: Emergency Medicine

## 2023-06-13 ENCOUNTER — Ambulatory Visit (INDEPENDENT_AMBULATORY_CARE_PROVIDER_SITE_OTHER): Payer: 59 | Admitting: Internal Medicine

## 2023-06-13 ENCOUNTER — Other Ambulatory Visit: Payer: Self-pay

## 2023-06-13 ENCOUNTER — Ambulatory Visit: Payer: Self-pay | Admitting: Family Medicine

## 2023-06-13 ENCOUNTER — Ambulatory Visit: Payer: 59 | Admitting: Family Medicine

## 2023-06-13 VITALS — BP 142/100 | HR 92 | Ht 73.0 in | Wt 220.0 lb

## 2023-06-13 DIAGNOSIS — N5089 Other specified disorders of the male genital organs: Secondary | ICD-10-CM | POA: Diagnosis not present

## 2023-06-13 DIAGNOSIS — R300 Dysuria: Secondary | ICD-10-CM

## 2023-06-13 DIAGNOSIS — R809 Proteinuria, unspecified: Secondary | ICD-10-CM

## 2023-06-13 DIAGNOSIS — F411 Generalized anxiety disorder: Secondary | ICD-10-CM | POA: Diagnosis not present

## 2023-06-13 DIAGNOSIS — M545 Low back pain, unspecified: Secondary | ICD-10-CM

## 2023-06-13 DIAGNOSIS — R030 Elevated blood-pressure reading, without diagnosis of hypertension: Secondary | ICD-10-CM

## 2023-06-13 DIAGNOSIS — I10 Essential (primary) hypertension: Secondary | ICD-10-CM | POA: Insufficient documentation

## 2023-06-13 LAB — URINALYSIS, ROUTINE W REFLEX MICROSCOPIC
Bacteria, UA: NONE SEEN
Bilirubin Urine: NEGATIVE
Glucose, UA: NEGATIVE mg/dL
Ketones, ur: 5 mg/dL — AB
Leukocytes,Ua: NEGATIVE
Nitrite: NEGATIVE
Protein, ur: 100 mg/dL — AB
Specific Gravity, Urine: 1.005 (ref 1.005–1.030)
pH: 6 (ref 5.0–8.0)

## 2023-06-13 LAB — COMPREHENSIVE METABOLIC PANEL
ALT: 43 U/L (ref 0–44)
AST: 27 U/L (ref 15–41)
Albumin: 4.5 g/dL (ref 3.5–5.0)
Alkaline Phosphatase: 66 U/L (ref 38–126)
Anion gap: 12 (ref 5–15)
BUN: 11 mg/dL (ref 6–20)
CO2: 26 mmol/L (ref 22–32)
Calcium: 9.5 mg/dL (ref 8.9–10.3)
Chloride: 98 mmol/L (ref 98–111)
Creatinine, Ser: 1.19 mg/dL (ref 0.61–1.24)
GFR, Estimated: >60 mL/min (ref >60)
Glucose, Bld: 96 mg/dL (ref 70–99)
Potassium: 3.3 mmol/L — ABNORMAL LOW (ref 3.5–5.1)
Sodium: 136 mmol/L (ref 135–145)
Total Bilirubin: 1.1 mg/dL (ref 0.0–1.2)
Total Protein: 7.7 g/dL (ref 6.5–8.1)

## 2023-06-13 LAB — POCT URINALYSIS DIP (CLINITEK)
Bilirubin, UA: NEGATIVE
Glucose, UA: NEGATIVE mg/dL
Leukocytes, UA: NEGATIVE
Nitrite, UA: NEGATIVE
POC PROTEIN,UA: >=300 — AB
Spec Grav, UA: 1.025 (ref 1.010–1.025)
Urobilinogen, UA: 0.2 U/dL
pH, UA: 6 (ref 5.0–8.0)

## 2023-06-13 LAB — CBC WITH DIFFERENTIAL/PLATELET
Abs Immature Granulocytes: 0.02 10*3/uL (ref 0.00–0.07)
Basophils Absolute: 0.1 10*3/uL (ref 0.0–0.1)
Basophils Relative: 1 %
Eosinophils Absolute: 0 10*3/uL (ref 0.0–0.5)
Eosinophils Relative: 0 %
HCT: 52.7 % — ABNORMAL HIGH (ref 39.0–52.0)
Hemoglobin: 17.9 g/dL — ABNORMAL HIGH (ref 13.0–17.0)
Immature Granulocytes: 0 %
Lymphocytes Relative: 19 %
Lymphs Abs: 1.5 10*3/uL (ref 0.7–4.0)
MCH: 28.9 pg (ref 26.0–34.0)
MCHC: 34 g/dL (ref 30.0–36.0)
MCV: 85.1 fL (ref 80.0–100.0)
Monocytes Absolute: 0.8 10*3/uL (ref 0.1–1.0)
Monocytes Relative: 10 %
Neutro Abs: 5.5 10*3/uL (ref 1.7–7.7)
Neutrophils Relative %: 70 %
Platelets: 280 10*3/uL (ref 150–400)
RBC: 6.19 MIL/uL — ABNORMAL HIGH (ref 4.22–5.81)
RDW: 11.9 % (ref 11.5–15.5)
WBC: 7.8 10*3/uL (ref 4.0–10.5)
nRBC: 0 % (ref 0.0–0.2)

## 2023-06-13 LAB — LIPASE, BLOOD: Lipase: 42 U/L (ref 11–51)

## 2023-06-13 MED ORDER — IOHEXOL 300 MG/ML  SOLN
100.0000 mL | Freq: Once | INTRAMUSCULAR | Status: AC | PRN
  Administered 2023-06-13: 100 mL via INTRAVENOUS

## 2023-06-13 NOTE — Patient Instructions (Signed)
 Please go to [facility] for further evaluation of hematuria. It would be helpful to speak with psychologist while you are there. Recommend ongoing counseling and perhaps meds for anxiety and possibly depression.

## 2023-06-13 NOTE — ED Triage Notes (Signed)
 Patient is here for evaluation of urinary issues. Reports pressure, pain, and foamy urine for the past few weeks. Pt reports able to urinate, but is painful. States has a history of kidney stones. Reports pain in the penis, but denies any discharge from it.

## 2023-06-13 NOTE — Discharge Instructions (Signed)
 You are seen in the emergency department today for concerns of dysuria.  Your labs and imaging were thankfully all reassuring without any specific abnormality to explain her current symptoms.  I would recommend following up with your primary care provider for repeat evaluation to reassess your symptoms if they are not improving.  You currently have a urine culture that is pending and will have results available in the next 1 to 2 days.  If anything is abnormal on this, you will be contacted did to discuss starting an antibiotic.  I would recommend) emergency department for any new or worsening symptoms.  Otherwise please follow-up with primary care provider.

## 2023-06-13 NOTE — ED Provider Notes (Signed)
 [place] EMERGENCY DEPARTMENT AT [facility] Provider Note   CSN: [id number] Arrival date & time: 06/13/23  1312     History Chief Complaint  Patient presents with   Dysuria    Don Howell is a 33 y.o. male.  Patient past history significant for anxiety, ADHD, and hypertension presents the emergency department with concerns of dysuria.  He reports having issues with his urination for several weeks with feelings of pain, foamy urine, and some color change to his urine.  He does endorse a history of kidney stones and states this feels somewhat different.  Endorsing some pain in the penis itself but denies any discharge.  States he is he is sexually active with 1 partner who is a long-term partner. Does report use of some toys when asked about any penetrative anal sex but denies penetrative anal sex. Some minor testicular discomfort but not drastically painful.   Dysuria Presenting symptoms: dysuria        Home Medications Prior to Admission medications   Medication Sig Start Date End Date Taking? Authorizing Provider  amLODipine (NORVASC) 5 MG tablet Take 1 tablet (5 mg total) by mouth daily. 05/30/23   Ardith Dark, MD  citalopram (CELEXA) 20 MG tablet TAKE 1 TABLET(20 MG) BY MOUTH DAILY 09/16/22   Ardith Dark, MD  lisdexamfetamine (VYVANSE) 20 MG capsule Take 1 capsule (20 mg total) by mouth daily. 05/30/23   Ardith Dark, MD      Allergies    Patient has no known allergies.    Review of Systems   Review of Systems  Genitourinary:  Positive for dysuria.  All other systems reviewed and are negative.   Physical Exam Updated Vital Signs BP (!) 153/100   Pulse 97   Temp 98.1 F (36.7 C) (Oral)   Resp 16   Ht 6\' 1"  (1.854 m)   Wt 99.8 kg   SpO2 97%   BMI 29.03 kg/m  Physical Exam Vitals and nursing note reviewed.  Constitutional:      General: He is not in acute distress.    Appearance: He is well-developed.  HENT:     Head: Normocephalic  and atraumatic.  Eyes:     Conjunctiva/sclera: Conjunctivae normal.  Cardiovascular:     Rate and Rhythm: Normal rate and regular rhythm.     Heart sounds: No murmur heard. Pulmonary:     Effort: Pulmonary effort is normal. No respiratory distress.     Breath sounds: Normal breath sounds.  Abdominal:     Palpations: Abdomen is soft.     Tenderness: There is no abdominal tenderness.  Genitourinary:    Penis: Normal.      Testes: Normal.     Epididymis:     Right: Tenderness present.     Left: Tenderness present.     Comments: Normal penis and testicles. No significant scrotal swelling. Mild TTP along the posterior margins of bilateral testicles. Musculoskeletal:        General: No swelling.     Cervical back: Neck supple.  Skin:    General: Skin is warm and dry.     Capillary Refill: Capillary refill takes less than 2 seconds.  Neurological:     Mental Status: He is alert.  Psychiatric:        Mood and Affect: Mood normal.     ED Results / Procedures / Treatments   Labs (all labs ordered are listed, but only abnormal results are displayed) Labs Reviewed  CBC WITH DIFFERENTIAL/PLATELET - Abnormal; Notable for the following components:      Result Value   RBC 6.19 (*)    Hemoglobin 17.9 (*)    HCT 52.7 (*)    All other components within normal limits  COMPREHENSIVE METABOLIC PANEL - Abnormal; Notable for the following components:   Potassium 3.3 (*)    All other components within normal limits  URINALYSIS, ROUTINE W REFLEX MICROSCOPIC - Abnormal; Notable for the following components:   Hgb urine dipstick MODERATE (*)    Ketones, ur 5 (*)    Protein, ur 100 (*)    All other components within normal limits  URINE CULTURE  LIPASE, BLOOD  GC/CHLAMYDIA PROBE AMP () NOT AT [facility]    EKG None  Radiology US SCROTUM W/DOPPLER Result Date: 06/13/2023 CLINICAL DATA:  Testicular pain for 1 week. EXAM: SCROTAL ULTRASOUND DOPPLER ULTRASOUND OF THE TESTICLES  TECHNIQUE: Complete ultrasound examination of the testicles, epididymis, and other scrotal structures was performed. Color and spectral Doppler ultrasound were also utilized to evaluate blood flow to the testicles. COMPARISON:  Pelvic CT same date. FINDINGS: Right testicle Measurements: 5.1 x 2.1 x 3.1 cm. Normal blood flow with color Doppler. Small echogenic foci inferiorly, likely calcifications. No focal mass lesion identified. Left testicle Measurements: 4.4 x 2.1 x 3.1 cm. Normal blood flow with color Doppler. No mass or microlithiasis visualized. Right epididymis:  Normal in size and appearance. Left epididymis:  Normal in size and appearance. Hydrocele:  Small bilateral hydroceles. Varicocele:  None visualized. Pulsed Doppler interrogation of both testes demonstrates normal low resistance arterial and venous waveforms bilaterally. IMPRESSION: 1. No evidence of testicular torsion or mass. 2. Small bilateral hydroceles. 3. Small echogenic foci in the right testicle, likely calcifications. Current literature suggests that testicular microlithiasis is not a significant independent risk factor for development of testicular carcinoma, and that follow up imaging is not warranted in the absence of other risk factors. Monthly testicular self-examination and annual physical exams are considered appropriate surveillance. If patient has other risk factors for testicular carcinoma, then referral to Urology should be considered. (Reference: DeCastro, et al.: A 5-Year Follow up Study of Asymptomatic Men with Testicular Microlithiasis. J Urol 2008; 179:1420-1423.) Electronically Signed   By: Carey Bullocks M.D.   On: 06/13/2023 18:06   CT ABDOMEN PELVIS W CONTRAST Result Date: 06/13/2023 CLINICAL DATA:  Right lower quadrant pain. Urinary symptoms. Dysuria. History kidney stones. Penile pain. EXAM: CT ABDOMEN AND PELVIS WITH CONTRAST TECHNIQUE: Multidetector CT imaging of the abdomen and pelvis was performed using the  standard protocol following bolus administration of intravenous contrast. RADIATION DOSE REDUCTION: This exam was performed according to the departmental dose-optimization program which includes automated exposure control, adjustment of the mA and/or kV according to patient size and/or use of iterative reconstruction technique. CONTRAST:  OMNIPAQUE IOHEXOL 300 MG/ML  SOLN COMPARISON:  None Available. FINDINGS: Lower chest: Clear lung bases. Normal heart size without pericardial or pleural effusion. Hepatobiliary: Normal liver. Normal gallbladder, without biliary ductal dilatation. Pancreas: Normal, without mass or ductal dilatation. Spleen: Normal in size, without focal abnormality. Adrenals/Urinary Tract: Normal adrenal glands. Normal kidneys, without hydronephrosis. Normal urinary bladder. Stomach/Bowel: Normal stomach, without wall thickening. Normal colon, appendix, and terminal ileum. Normal small bowel. Vascular/Lymphatic: Normal caliber of the aorta and branch vessels. Retroaortic left renal vein. No abdominopelvic adenopathy. Reproductive: Normal prostate. Other: No significant free fluid.  No free intraperitoneal air. Musculoskeletal: No acute osseous abnormality. IMPRESSION: Normal abdominopelvic CT. No explanation for  patient's symptoms. Normal appendix. Electronically Signed   By: Jeronimo Greaves M.D.   On: 06/13/2023 16:19    Procedures Procedures    Medications Ordered in ED Medications  iohexol (OMNIPAQUE) 300 MG/ML solution 100 mL (100 mLs Intravenous Contrast Given 06/13/23 1601)    ED Course/ Medical Decision Making/ A&P                                 Medical Decision Making Amount and/or Complexity of Data Reviewed Labs: ordered. Radiology: ordered.   This patient presents to the ED for concern of dysuria.  Differential diagnosis includes UTI, pyelonephritis, urolithiasis, epididymitis, prostatitis   Lab Tests:  I Ordered, and personally interpreted labs.  The  pertinent results include: CBC with possible consultation with increases in hemoglobin up to 17.9, CMP with mild hypokalemia 3.3 UA without signs infection with hemoglobin present, lipase normal, urine culture collected and pending, GC/chlamydia collected and pending.   Imaging Studies ordered:  I ordered imaging studies including CT abdomen pelvis, scrotal ultrasound I independently visualized and interpreted imaging which showed normal abdominopelvic CT. No explanation for patient's symptoms. Normal appendix.. No evidence of testicular torsion or mass. 2. Small bilateral hydroceles. 3. Small echogenic foci in the right testicle, likely calcifications. Current literature suggests that testicular microlithiasis is not a significant independent risk factor for development of testicular carcinoma, and that follow up imaging is not warranted in the absence of other risk factors. Monthly testicular self-examination and annual physical exams are considered appropriate surveillance. If patient has other risk factors for testicular carcinoma, then referral to Urology should be considered. I agree with the radiologist interpretation   Problem List / ED Course:  Patient presents emergency department concerns of dysuria.  Endorses this is been ongoing for the last few weeks.  Endorses some increased urgency, bladder pain, and foamy urine.  States that he is having no difficulty with urination but typically this worsens his pain.  He does endorse a history of prior kidney stones but no clear or discernible flank pain.  Also endorsing some pain inside the penis but denies any discharge.  Patient is sexually active but no concerns for STIs.  States he has been with the same partner for several years. Exam reveals some mild left-sided CVA tenderness.  No significant abdominal tenderness with exception towards the left abdomen.  Obtain labs and imaging for assessment of patient's symptoms.  Given patient's age,  patient is considered high risk for possible STI such as gonorrhea or chlamydia to be source of symptoms in the setting of decreased risk factors such as same sexual partner, and no urethral discharge, less likely. Basic labs are all reassuring.  Urine does not show any acute signs of infection.  There is hemoglobin present and protein present in the urine.  This is likely the source of patient's foamy urine. Performed testicular/pelvic exam with no abnormal findings seen. Some mild TTP along the posterior aspect of bilateral testicles. Possible epididymitis symptoms. Will order scrotal US.  [place] negative. Unclear cause of symptoms at this time. Differential includes interstitial cystitis, kidney disease, prostatitis, etc. Advised patient that he should follow up with his PCP for repeat evaluation and return to the ED for any new or concerning symptoms. Patient otherwise stable and discharged home for outpatient follow up.  Final Clinical Impression(s) / ED Diagnoses Final diagnoses:  Dysuria  Proteinuria, unspecified type  Elevated blood pressure reading  Rx / DC Orders ED Discharge Orders     None         Smitty Knudsen, PA-C 06/14/23 1533    Melene Plan, DO 06/15/23 2259

## 2023-06-13 NOTE — Progress Notes (Signed)
 Patient Care Team: Ardith Dark, MD as PCP - General (Family Medicine)  Visit Date: 06/13/23  Subjective:   Chief Complaint  Patient presents with   Dysuria    Patient complains of pain and pressure when urination, right lower abdominal pain and right flank pain. Going for for 1-2 weeks.   Patient UE:[id number] Thelen,Male DOB:01/04/1991,32 y.o. JYN:[id number]   33 y.o. Male presents today for acute sick visit with Dysuria. His regular Primary Care Physician is Dr. Jacquiline Doe and he was referred here by the Call Center as Dr. Jimmey Ralph is  out of the office today. Patient has a past medical history of Kidney Stones in the remote past, per patient, and Anxiety. He reports that 1 week ago began to feel burning, stinging, and discomfort with urination, back pain, and 1 instance of sharp RLQ pain. Regarding the back pain he notes he does work at [employer], so there is some lifting anywhere from 0-75 pounds and it is possible that he could have injured himself, but was concerned considering his other symptoms onset around the same time. Is sexually active, he says but only with one long-term partner.   During discussion of his symptoms, he became extremely anxious and tearful, mentioning anxiety regarding his blood pressure, which today is 142/100 but was recently 151/95, and last year was also elevated at 133/90. Has been started on 5 mg Amlodipine per his PCP,  Dr. Jacquiline Doe. He says that due to his anxiety he hasn't been eating much, to the point he has lost 17 pounds in the past 3 weeks which may explain why he is moderately ketonic on his UA today. Regarding his anxiety, he was recently restarted on 20 mg Celexa daily by Dr. Jimmey Ralph, but says that he has just been so concerned though does not mention any other specifics than what is already noted. Informs [place] that he has recently started online counseling with a provider at [facility], though does not explain what he is being seen for  precisely but he says he has been told he has an anxiety disorder. Denies intentions to harm himself. Very worried about hi health  Past Medical History:  Diagnosis Date   ADD (attention deficit disorder)    Depression   No Known Allergies  No family history on file.  Social Hx: Works for [employer] x 12 years. Previously studies at [employer]. Mother is supportive and came to  the office when I called her about patient  Review of Systems  Genitourinary:  Positive for dysuria (burning, stinging, discomfort).  Musculoskeletal:  Positive for back pain (posterior superior iliac-spine).  Psychiatric/Behavioral:  The patient is nervous/anxious.      Objective:  Vitals: BP (!) 142/100   Pulse 92   Ht 6\' 1"  (1.854 m)   Wt 220 lb (99.8 kg)   SpO2 97%   BMI 29.03 kg/m   Physical Exam Vitals and nursing note reviewed.  Constitutional:      General: He is in acute distress (extremely anxious).     Appearance: Normal appearance. He is not ill-appearing.  HENT:     Head: Normocephalic and atraumatic.  Pulmonary:     Effort: Pulmonary effort is normal.  Musculoskeletal:     Lumbar back: Tenderness (Posteior superior iliac-spine) present.  Skin:    General: Skin is warm and dry.  Neurological:     Mental Status: He is alert and oriented to person, place, and time. Mental status is at baseline.  Deep Tendon Reflexes:     Reflex Scores:      Patellar reflexes are 2+ on the right side and 2+ on the left side.    Comments: Strength normal in legs  Psychiatric:        Attention and Perception: He does not perceive visual hallucinations.        Mood and Affect: Mood is anxious (expresses anxiety over his health in general). Affect is tearful.        Speech: Speech normal. Speech is not slurred.        Behavior: Behavior is cooperative.        Thought Content: Thought content is not delusional.     Results:  Studies Obtained And Personally Reviewed By Me: Labs:     Component Value  Date/Time   NA 140 05/30/2023 1122   K 4.1 05/30/2023 1122   CL 103 05/30/2023 1122   CO2 26 05/30/2023 1122   GLUCOSE 96 05/30/2023 1122   BUN 16 05/30/2023 1122   CREATININE 1.15 05/30/2023 1122   CALCIUM 9.3 05/30/2023 1122   PROT 7.2 05/30/2023 1122   ALBUMIN 4.4 05/30/2023 1122   AST 25 05/30/2023 1122   ALT 40 05/30/2023 1122   ALKPHOS 75 05/30/2023 1122   BILITOT 0.7 05/30/2023 1122    Lab Results  Component Value Date   WBC 6.1 05/30/2023   HGB 17.8 (H) 05/30/2023   HCT 53.2 (H) 05/30/2023   MCV 86.6 05/30/2023   PLT 224.0 05/30/2023   Lab Results  Component Value Date   CHOL 178 05/30/2023   HDL 60.50 05/30/2023   LDLCALC 101 (H) 05/30/2023   LDLDIRECT 112.0 04/12/2019   TRIG 84.0 05/30/2023   CHOLHDL 3 05/30/2023   Lab Results  Component Value Date   HGBA1C 5.3 05/30/2023    Lab Results  Component Value Date   TSH 1.46 05/30/2023      Results for orders placed or performed in visit on 06/13/23  POCT URINALYSIS DIP (CLINITEK)  Result Value Ref Range   Color, UA brown (A) yellow   Clarity, UA clear clear   Glucose, UA negative negative mg/dL   Bilirubin, UA negative negative   Ketones, POC UA moderate (40) (A) negative mg/dL   Spec Grav, UA 2.725 3.664 - 1.025   Blood, UA large (A) negative   pH, UA 6.0 5.0 - 8.0   POC PROTEIN,UA >=300 (A) negative, trace   Urobilinogen, UA 0.2 0.2 or 1.0 E.U./dL   Nitrite, UA Negative Negative   Leukocytes, UA Negative Negative   Assessment & Plan:  In the office today, patient became distraught and began to cry. He called his Mother, who is concerned and came to the office.  I don't think he should drive at present.  He expressed that he did not want to go to the ED, however discussed with him that I think it is in his best interest for further evaluation of his hematuria and anxiety. He is fearful of what might be found during the evaluation. He does have an appointment Thursday next week with Dr. Jimmey Ralph, his PCP.  He resides alone.  Anxiety State, hx of anxiety disorder, was recently restarted on Celexa and established with online counseling but is acutely distressed today in office. Was supposed to go into work today, but does not feel that he should considering his mood, and I agree he does not need to be at work or driving. Will provide a note to excuse him from  work. He is crying in the office today. He called his mother who came promptly and will go with him to ED. He needs urgent medical and Psych evaluation today. Denies intention to harm himself.  Right Low Back Pain could be musculoskeletal or related to his urinary symptoms. He has hematuria  could this be ?renal stone or mass.Also, Could be mechanical back pain as he works at [employer] and is tender over posterior superior iliac spine.  Hematuria could be intrinsic kidney disease, mass, or stone, would like him to go to ED for evaluation and imaging. Not having excruciating pain  Weight loss- lost 14 pounds since Feb 14. Hgb was slightly increased then at 17.8g and had been 16.3 one year ago. WBC was normal. Platelet count was normal. Creatinine was 1.15. Electrolytes were normal. TSH was normal then. MCV was normal then.  Urine results reviewed and discussed with pt. Mother came to office. Is concerned and will transport him by private vehicle for evaluation at ED.  Note given to be out of work through Friday March 7th.  I,Emily Lagle,acting as a Neurosurgeon for Margaree Mackintosh, MD.,have documented all relevant documentation on the behalf of Margaree Mackintosh, MD,as directed by  Margaree Mackintosh, MD while in the presence of Margaree Mackintosh, MD.   I, Margaree Mackintosh, MD, have reviewed all documentation for this visit. The documentation on 06/13/23 for the exam, diagnosis, procedures, and orders are all accurate and complete.

## 2023-06-13 NOTE — Telephone Encounter (Signed)
 Noted.

## 2023-06-13 NOTE — ED Provider Triage Note (Signed)
 Emergency Medicine Provider Triage Evaluation Note  Don Howell , a 33 y.o. male  was evaluated in triage.  Pt complains of right lower quadrant abdominal pain that radiates to the back, endorses dysuria and foamy urine.  Has a history of kidney stones.  Review of Systems  Positive:  Negative:   Physical Exam  BP (!) 173/110   Pulse 91   Temp 97.8 F (36.6 C) (Oral)   Resp 16   Ht 6\' 1"  (1.854 m)   Wt 99.8 kg   SpO2 99%   BMI 29.03 kg/m  Gen:   Awake, no distress   Resp:  Normal effort  MSK:   Moves extremities without difficulty  Other:  Right lower quadrant abdominal tenderness, no CVAT  Medical Decision Making  Medically screening exam initiated at 2:04 PM.  Appropriate orders placed.  Don Howell was informed that the remainder of the evaluation will be completed by another provider, this initial triage assessment does not replace that evaluation, and the importance of remaining in the ED until their evaluation is complete.     Halford Decamp, PA-C 06/13/23 [id number]

## 2023-06-13 NOTE — Telephone Encounter (Signed)
  Chief Complaint: Pain with urination-possible UTI per patient Symptoms: burning with urination, pain to groin area Frequency: 1 week Pertinent Negatives: Patient denies fever Disposition: [] ED /[] Urgent Care (no appt availability in office) / [x] Appointment(In office/virtual)/ []  [place] [facility]/ [] Home Care/ [] Refused Recommended Disposition /[] [place] [facility]/ []  Follow-up with PCP Additional Notes: Patient called with concerns for pain with urination along with concern for possible UTI per patient. Per patient report, he has kidney stones and is having pain to groin area. Patient states symptoms have been going on for a week. Per protocol, recommendation to be seen within 24 hours. Did have an appointment with PCP scheduled for today but was canceled by provider. Patient is okay with going to another office. Appointment scheduled for today at 12:00 pm at alternative office location. Information for appointment along with address is given to patient. Patient verbalized understanding of plan and all questions answered.    Copied from CRM [id number]. Topic: Clinical - Red Word Triage >> Jun 13, 2023 11:01 AM Lennart Pall wrote: Red Word that prompted transfer to Nurse Triage: Patient has possible UTI. Has been passing kidney stones. Having more pain in groin area. Reason for Disposition  All other males with painful urination  Answer Assessment - Initial Assessment Questions 1. SEVERITY: "How bad is the pain?"  (e.g., Scale 1-10; mild, moderate, or severe)   - MILD (1-3): Complains slightly about urination hurting.   - MODERATE (4-7): Interferes with normal activities.     - SEVERE (8-10): Excruciating, unwilling or unable to urinate because of the pain.      4 out of 10 2. FREQUENCY: "How many times have you had painful urination today?"      Any time he has urinated for the last week 3. PATTERN: "Is pain present every time you urinate or just sometimes?"      Every time 4.  ONSET: "When did the painful urination start?"      Started a week ago 5. FEVER: "Do you have a fever?" If Yes, ask: "What is your temperature, how was it measured, and when did it start?"     No 6. PAST UTI: "Have you had a urine infection before?" If Yes, ask: "When was the last time?" and "What happened that time?"      No 7. CAUSE: "What do you think is causing the painful urination?"      Kidney stones and possible UTI 8. OTHER SYMPTOMS: "Do you have any other symptoms?" (e.g., flank pain, penis discharge, scrotal pain, blood in urine)     Urine has foam in it  Protocols used: Urination Pain - Male-A-AH

## 2023-06-15 LAB — URINE CULTURE
Culture: NO GROWTH
Special Requests: NORMAL

## 2023-06-16 LAB — GC/CHLAMYDIA PROBE AMP (~~LOC~~) NOT AT ARMC
Chlamydia: NEGATIVE
Comment: NEGATIVE
Comment: NORMAL
Neisseria Gonorrhea: NEGATIVE

## 2023-06-19 ENCOUNTER — Encounter: Payer: Self-pay | Admitting: Family Medicine

## 2023-06-19 ENCOUNTER — Ambulatory Visit: Payer: 59 | Admitting: Family Medicine

## 2023-06-19 VITALS — BP 160/120 | HR 98 | Temp 98.1°F | Ht 73.0 in | Wt 219.6 lb

## 2023-06-19 DIAGNOSIS — R809 Proteinuria, unspecified: Secondary | ICD-10-CM | POA: Diagnosis not present

## 2023-06-19 DIAGNOSIS — Z119 Encounter for screening for infectious and parasitic diseases, unspecified: Secondary | ICD-10-CM

## 2023-06-19 DIAGNOSIS — F411 Generalized anxiety disorder: Secondary | ICD-10-CM

## 2023-06-19 DIAGNOSIS — I10 Essential (primary) hypertension: Secondary | ICD-10-CM | POA: Diagnosis not present

## 2023-06-19 LAB — URINALYSIS, ROUTINE W REFLEX MICROSCOPIC
Bilirubin Urine: NEGATIVE
Ketones, ur: NEGATIVE
Leukocytes,Ua: NEGATIVE
Nitrite: NEGATIVE
Specific Gravity, Urine: 1.01 (ref 1.000–1.030)
Total Protein, Urine: 100 — AB
Urine Glucose: NEGATIVE
Urobilinogen, UA: 0.2 (ref 0.0–1.0)
pH: 6 (ref 5.0–8.0)

## 2023-06-19 MED ORDER — SERTRALINE HCL 50 MG PO TABS
50.0000 mg | ORAL_TABLET | Freq: Every day | ORAL | 3 refills | Status: DC
Start: 2023-06-19 — End: 2023-10-16

## 2023-06-19 MED ORDER — HYDROXYZINE HCL 50 MG PO TABS: 50.0000 mg | ORAL_TABLET | Freq: Three times a day (TID) | ORAL | 0 refills | Status: DC | PRN

## 2023-06-19 NOTE — Patient Instructions (Addendum)
 It was very nice to see you today!  Please switch your celexa to Zoloft. Please use the hydroxyzine as needed.  We will check another urine sample today.  We may need to have you see the urologist depending on results.  Return in about 2 weeks (around 07/03/2023).   Take care, Dr Jimmey Ralph  PLEASE NOTE:  If you had any lab tests, please let us know if you have not heard back within a few days. You may see your results on mychart before we have a chance to review them but we will give you a call once they are reviewed by [place].   If we ordered any referrals today, please let us know if you have not heard from their office within the next week.   If you had any urgent prescriptions sent in today, please check with the pharmacy within an hour of our visit to make sure the prescription was transmitted appropriately.   Please try these tips to maintain a healthy lifestyle:  Eat at least 3 REAL meals and 1-2 snacks per day.  Aim for no more than 5 hours between eating.  If you eat breakfast, please do so within one hour of getting up.   Each meal should contain half fruits/vegetables, one quarter protein, and one quarter carbs (no bigger than a computer mouse)  Cut down on sweet beverages. This includes juice, soda, and sweet tea.   Drink at least 1 glass of water with each meal and aim for at least 8 glasses per day  Exercise at least 150 minutes every week.

## 2023-06-19 NOTE — Progress Notes (Signed)
 His urine still shows little bit of blood.  As we discussed at his visit I believe this is probably due to kidney stones.  Recommend referral to urology as we discussed at his office visit.

## 2023-06-19 NOTE — Assessment & Plan Note (Signed)
 Not controlled.  Had lengthy discussion with patient today regarding his anxiety.  He has not had any success with his Celexa and it is likely causing him quite a bit of issues with diarrhea as well.  We discussed alternative treatment options.  We will switch to Zoloft 50 mg daily.  Also start hydroxyzine 50 mg to use 3 times daily as needed for anxiety.  He has done well with this in the past previously.  He is already established with a therapist as well.  He will follow-up me in a couple weeks and we can adjust meds as needed.  If he does not do well with Zoloft would consider trial of Prozac or BuSpar at that time.

## 2023-06-19 NOTE — Assessment & Plan Note (Signed)
 Elevated today though likely exacerbated by his recent anxiety.  He has been tolerating his amlodipine 5 mg daily well.  His had a few readings at home which have been at goal.  Anticipate this will improve significantly once we get his anxiety under better control.  Will continue his amlodipine 5 mg daily.  He will follow-up here in a couple of weeks.

## 2023-06-19 NOTE — Progress Notes (Signed)
 Don Howell is a 33 y.o. male who presents today for an office visit.  Assessment/Plan:  New/Acute Problems: Proteinuria / Hematuria No longer having any dysuria or testicular pain.  May have had a kidney stone that passed.  Will check UA again today.  If still has persistent proteinuria or hematuria will need referral back to urology.  Renal function was stable on most recent labs.  Chronic Problems Addressed Today: Generalized anxiety disorder Not controlled.  Had lengthy discussion with patient today regarding his anxiety.  He has not had any success with his Celexa and it is likely causing him quite a bit of issues with diarrhea as well.  We discussed alternative treatment options.  We will switch to Zoloft 50 mg daily.  Also start hydroxyzine 50 mg to use 3 times daily as needed for anxiety.  He has done well with this in the past previously.  He is already established with a therapist as well.  He will follow-up me in a couple weeks and we can adjust meds as needed.  If he does not do well with Zoloft would consider trial of Prozac or BuSpar at that time.  Essential hypertension Elevated today though likely exacerbated by his recent anxiety.  He has been tolerating his amlodipine 5 mg daily well.  His had a few readings at home which have been at goal.  Anticipate this will improve significantly once we get his anxiety under better control.  Will continue his amlodipine 5 mg daily.  He will follow-up here in a couple of weeks.    Subjective:  HPI:  See Assessment / plan for status of chronic conditions.  I last saw him a few weeks ago for annual physical.  At that time we restarted Celexa 20 mg daily.  We also refilled his Vyvanse.  We started him on amlodipine for elevated blood pressure reading.  Since our last visit he had an episode of hematuria and dysuria.  This was a associated with testicular pain as well.  He went to the ED and had further evaluation including CT abdomen  pelvis and scrotal ultrasound.  His scrotal ultrasound did not show any testicular torsion or mass however did show bilateral hydroceles.  His urinalysis did show protein and hemoglobin.  He is still having quite a bit of anxiety.  Does not feel like his anxiety is well-controlled.  Does not think the Celexa is effective.  He has no appetite and has had difficulty with eating.  Has also had persistent diarrhea since our last visit.  He has lost about 18 pounds over the last month or so.  He is not having any more dysuria.  Still having occasional foamy urine.  He has not noticed any hematuria.  No flank pain.  No testicular pain.  No fevers or chills.  His blood pressure has been elevated.  He has been much more anxious the last few days.  He recently started seeing a therapist which does seem to be working well for him.       Objective:  Physical Exam: BP (!) 160/120   Pulse 98   Temp 98.1 F (36.7 C) (Temporal)   Ht 6\' 1"  (1.854 m)   Wt 219 lb 9.6 oz (99.6 kg)   SpO2 99%   BMI 28.97 kg/m   Wt Readings from Last 3 Encounters:  06/19/23 219 lb 9.6 oz (99.6 kg)  06/13/23 220 lb 0.3 oz (99.8 kg)  06/13/23 220 lb (99.8 kg)  Gen: No acute distress, resting comfortably CV: Regular rate and rhythm with no murmurs appreciated Pulm: Normal work of breathing, clear to auscultation bilaterally with no crackles, wheezes, or rhonchi Neuro: Grossly normal, moves all extremities Psych: Normal affect and thought content      Don Howell M. Jimmey Ralph, MD 06/19/2023 12:47 PM

## 2023-06-20 ENCOUNTER — Telehealth: Payer: Self-pay | Admitting: *Deleted

## 2023-06-20 NOTE — Telephone Encounter (Signed)
 Copied from CRM [id number]. Topic: General - Other >> Jun 20, 2023 12:56 PM Truddie Crumble wrote: Reason for QVZ:[id number] called stating he need a note stating he can work today

## 2023-06-20 NOTE — Telephone Encounter (Signed)
 Copied from CRM [id number]. Topic: General - Other >> Jun 20, 2023  4:03 PM Eunice Blase wrote: Reason for CRM: Pt called needs note for clearance to return to work. Please send to MyChart.

## 2023-06-23 ENCOUNTER — Encounter: Payer: Self-pay | Admitting: Family Medicine

## 2023-06-23 ENCOUNTER — Telehealth: Admitting: Physician Assistant

## 2023-06-23 DIAGNOSIS — Z5689 Other problems related to employment: Secondary | ICD-10-CM | POA: Diagnosis not present

## 2023-06-23 DIAGNOSIS — Z021 Encounter for pre-employment examination: Secondary | ICD-10-CM | POA: Diagnosis not present

## 2023-06-23 DIAGNOSIS — Z7689 Persons encountering health services in other specified circumstances: Secondary | ICD-10-CM

## 2023-06-23 NOTE — Telephone Encounter (Signed)
 Pt seen in office and called to ask patient dates he was out of work for accuracy purposes of letter he is requesting. Advised office cb number and/or letting us know via MyChart for this to be completed.

## 2023-06-23 NOTE — Telephone Encounter (Signed)
 Dr. Jimmey Ralph, did you tell patient not to go to work and if so is he okay to go back to work?

## 2023-06-23 NOTE — Patient Instructions (Signed)
  Don Howell, thank you for joining Don Loveless, PA-C for today's virtual visit.  While this provider is not your primary care provider (PCP), if your PCP is located in our provider database this encounter information will be shared with them immediately following your visit.   A [facility] MyChart account gives you access to today's visit and all your visits, tests, and labs performed at [facility] " click here if you don't have a [name] MyChart account or go to [email or web address]  Consent: (Patient) Don Howell provided verbal consent for this virtual visit at the beginning of the encounter.  Current Medications:  Current Outpatient Medications:    amLODipine (NORVASC) 5 MG tablet, Take 1 tablet (5 mg total) by mouth daily., Disp: 90 tablet, Rfl: 3   hydrOXYzine (ATARAX) 50 MG tablet, Take 1 tablet (50 mg total) by mouth 3 (three) times daily as needed for anxiety., Disp: 90 tablet, Rfl: 0   lisdexamfetamine (VYVANSE) 20 MG capsule, Take 1 capsule (20 mg total) by mouth daily., Disp: 30 capsule, Rfl: 0   sertraline (ZOLOFT) 50 MG tablet, Take 1 tablet (50 mg total) by mouth daily., Disp: 30 tablet, Rfl: 3   Medications ordered in this encounter:  No orders of the defined types were placed in this encounter.    *If you need refills on other medications prior to your next appointment, please contact your pharmacy*  Follow-Up: Call back or seek an in-person evaluation if the symptoms worsen or if the condition fails to improve as anticipated.  [facility] [phone]   If you have been instructed to have an in-person evaluation today at a local Urgent Care facility, please use the link below. It will take you to a list of all of our available [facility], including address, phone number and hours of operation. Please do not delay care.  [facility]  If you or a family member do not have a primary care  provider, use the link below to schedule a visit and establish care. When you choose a [facility] primary care physician or advanced practice provider, you gain a long-term partner in health. Find a Primary Care Provider  Learn more about [facility] in-office and virtual care options:  - Get Care Now

## 2023-06-23 NOTE — Telephone Encounter (Signed)
 Okay to write letter stating he can go back to work.

## 2023-06-23 NOTE — Telephone Encounter (Signed)
 Ok to write letter.  Katina Degree. Jimmey Ralph, MD 06/23/2023 12:16 PM

## 2023-06-23 NOTE — Progress Notes (Signed)
 Virtual Visit Consent   Don Howell, you are scheduled for a virtual visit with a [facility] provider today. Just as with appointments in the office, your consent must be obtained to participate. Your consent will be active for this visit and any virtual visit you may have with one of our providers in the next 365 days. If you have a MyChart account, a copy of this consent can be sent to you electronically.  As this is a virtual visit, video technology does not allow for your provider to perform a traditional examination. This may limit your provider's ability to fully assess your condition. If your provider identifies any concerns that need to be evaluated in person or the need to arrange testing (such as labs, EKG, etc.), we will make arrangements to do so. Although advances in technology are sophisticated, we cannot ensure that it will always work on either your end or our end. If the connection with a video visit is poor, the visit may have to be switched to a telephone visit. With either a video or telephone visit, we are not always able to ensure that we have a secure connection.  By engaging in this virtual visit, you consent to the provision of healthcare and authorize for your insurance to be billed (if applicable) for the services provided during this visit. Depending on your insurance coverage, you may receive a charge related to this service.  I need to obtain your verbal consent now. Are you willing to proceed with your visit today? [name] has provided verbal consent on 06/23/2023 for a virtual visit (video or telephone). [name], PA-C  Date: 06/23/2023 12:29 PM   Virtual Visit via Video Note   IMargaretann Loveless, connected with  Don Howell  ([id number], 03-Dec-1990) on 06/23/23 at 12:30 PM EDT by a video-enabled telemedicine application and verified that I am speaking with the correct person using two identifiers.  Location: Patient: Virtual Visit  Location Patient: Home Provider: Virtual Visit Location Provider: Home Office   I discussed the limitations of evaluation and management by telemedicine and the availability of in person appointments. The patient expressed understanding and agreed to proceed.    History of Present Illness: Don Howell is a 33 y.o. who identifies as a male who was assigned male at birth, and is being seen today for return to work letter. He was seen by his PCP office on 06/13/23 and written out of work until 06/20/23. His employer requires a return to work letter for clearance. He reports he feels well and is ready to return to work.   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    Allergies: No Known Allergies Medications:  Current Outpatient Medications:    amLODipine (NORVASC) 5 MG tablet, Take 1 tablet (5 mg total) by mouth daily., Disp: 90 tablet, Rfl: 3   hydrOXYzine (ATARAX) 50 MG tablet, Take 1 tablet (50 mg total) by mouth 3 (three) times daily as needed for anxiety., Disp: 90 tablet, Rfl: 0   lisdexamfetamine (VYVANSE) 20 MG capsule, Take 1 capsule (20 mg total) by mouth daily., Disp: 30 capsule, Rfl: 0   sertraline (ZOLOFT) 50 MG tablet, Take 1 tablet (50 mg total) by mouth daily., Disp: 30 tablet, Rfl: 3  Observations/Objective: Patient is well-developed, well-nourished in no acute  distress.  Resting comfortably at home.  Head is normocephalic, atraumatic.  No labored breathing.  Speech is clear and coherent with logical content.  Patient is alert and oriented at baseline.    Assessment and Plan: 1. Return to work evaluation (Primary)  - Work note for full return without restrictions provided.  Follow Up Instructions: I discussed the assessment and treatment plan with  the patient. The patient was provided an opportunity to ask questions and all were answered. The patient agreed with the plan and demonstrated an understanding of the instructions.  A copy of instructions were sent to the patient via MyChart unless otherwise noted below.    The patient was advised to call back or seek an in-person evaluation if the symptoms worsen or if the condition fails to improve as anticipated.    [name], PA-C

## 2023-06-24 ENCOUNTER — Other Ambulatory Visit: Payer: Self-pay | Admitting: *Deleted

## 2023-06-24 DIAGNOSIS — R319 Hematuria, unspecified: Secondary | ICD-10-CM

## 2023-06-25 ENCOUNTER — Encounter: Payer: Self-pay | Admitting: *Deleted

## 2023-06-25 NOTE — Telephone Encounter (Signed)
 Noted.

## 2023-06-27 ENCOUNTER — Ambulatory Visit: Payer: 59 | Admitting: Family Medicine

## 2023-07-01 ENCOUNTER — Other Ambulatory Visit: Payer: Self-pay | Admitting: Family Medicine

## 2023-07-02 MED ORDER — LISDEXAMFETAMINE DIMESYLATE 20 MG PO CAPS: 20.0000 mg | ORAL_CAPSULE | Freq: Every day | ORAL | 0 refills | Status: DC

## 2023-07-03 ENCOUNTER — Encounter: Payer: Self-pay | Admitting: Family Medicine

## 2023-07-03 ENCOUNTER — Ambulatory Visit (INDEPENDENT_AMBULATORY_CARE_PROVIDER_SITE_OTHER): Admitting: Family Medicine

## 2023-07-03 VITALS — BP 133/85 | HR 106 | Temp 98.4°F | Ht 73.0 in | Wt 215.2 lb

## 2023-07-03 DIAGNOSIS — F988 Other specified behavioral and emotional disorders with onset usually occurring in childhood and adolescence: Secondary | ICD-10-CM

## 2023-07-03 DIAGNOSIS — I10 Essential (primary) hypertension: Secondary | ICD-10-CM | POA: Diagnosis not present

## 2023-07-03 DIAGNOSIS — F411 Generalized anxiety disorder: Secondary | ICD-10-CM

## 2023-07-03 NOTE — Assessment & Plan Note (Signed)
 He is doing much better with Zoloft 50 mg daily.  Did have some diarrhea with Celexa but this is improving.  He has had to use hydroxyzine a few times but has been able to wean off this last several days.  We did discuss increasing dose however he would like to hold off on this for now.  Will continue Zoloft 50 mg daily.  Anticipate that his symptoms will continue to improve over the next several weeks.  He will follow-up with [place] in a few weeks via MyChart we can adjust meds as tolerated.

## 2023-07-03 NOTE — Assessment & Plan Note (Signed)
 On Vyvanse 20 mg daily.  He is tolerating this well.  Medications help him stay focused and on task.  We refilled this last month.  Does not need refill today.  Follow-up in 3 months.

## 2023-07-03 NOTE — Progress Notes (Signed)
   Don Howell is a 33 y.o. male who presents today for an office visit.  Assessment/Plan:  Chronic Problems Addressed Today: Generalized anxiety disorder He is doing much better with Zoloft 50 mg daily.  Did have some diarrhea with Celexa but this is improving.  He has had to use hydroxyzine a few times but has been able to wean off this last several days.  We did discuss increasing dose however he would like to hold off on this for now.  Will continue Zoloft 50 mg daily.  Anticipate that his symptoms will continue to improve over the next several weeks.  He will follow-up with [place] in a few weeks via MyChart we can adjust meds as tolerated.  ADD (attention deficit disorder) without hyperactivity On Vyvanse 20 mg daily.  He is tolerating this well.  Medications help him stay focused and on task.  We refilled this last month.  Does not need refill today.  Follow-up in 3 months.  Essential hypertension Initially elevated but much better on recheck.  This is likely related to his anxiety.  Will continue his amlodipine 5 mg daily.  He will continue to monitor at home and let us know in a few weeks how he is doing.  Anticipate this will improve significantly as his body improves.     Subjective:  HPI:  See A/P for status of chronic conditions.  Patient is here today for follow-up.  I last saw him 2 weeks ago.  At that time we had lengthy discussion regarding treatment for his generalized anxiety disorder.  We switched his Celexa to Zoloft 50 mg daily.  Also started hydroxyzine 50 mg 3 times daily as needed.  He feels like this regimen has worked well for him. His anxiety is much better controlled. He was having some diarrhea which has resolved. His hydroxyzine has helped.  He did have to take this quite a bit initially however has not had to use this as much recently.       Objective:  Physical Exam: BP 133/85   Pulse (!) 106   Temp 98.4 F (36.9 C) (Temporal)   Ht 6\' 1"  (1.854 m)   Wt  215 lb 3.2 oz (97.6 kg)   SpO2 99%   BMI 28.39 kg/m   Wt Readings from Last 3 Encounters:  07/03/23 215 lb 3.2 oz (97.6 kg)  06/19/23 219 lb 9.6 oz (99.6 kg)  06/13/23 220 lb 0.3 oz (99.8 kg)    Gen: No acute distress, resting comfortably CV: Regular rate and rhythm with no murmurs appreciated Pulm: Normal work of breathing, clear to auscultation bilaterally with no crackles, wheezes, or rhonchi Neuro: Grossly normal, moves all extremities Psych: Normal affect and thought content      Don Howell M. Jimmey Ralph, MD 07/03/2023 11:55 AM

## 2023-07-03 NOTE — Patient Instructions (Addendum)
 It was very nice to see you today!  I am glad that you are feeling better.  Will not make any medication changes today.  Please let me know in a few weeks how you are doing on MyChart.  Return in about 3 months (around 10/03/2023) for Follow Up.   Take care, Dr Jimmey Ralph  PLEASE NOTE:  If you had any lab tests, please let us know if you have not heard back within a few days. You may see your results on mychart before we have a chance to review them but we will give you a call once they are reviewed by [place].   If we ordered any referrals today, please let us know if you have not heard from their office within the next week.   If you had any urgent prescriptions sent in today, please check with the pharmacy within an hour of our visit to make sure the prescription was transmitted appropriately.   Please try these tips to maintain a healthy lifestyle:  Eat at least 3 REAL meals and 1-2 snacks per day.  Aim for no more than 5 hours between eating.  If you eat breakfast, please do so within one hour of getting up.   Each meal should contain half fruits/vegetables, one quarter protein, and one quarter carbs (no bigger than a computer mouse)  Cut down on sweet beverages. This includes juice, soda, and sweet tea.   Drink at least 1 glass of water with each meal and aim for at least 8 glasses per day  Exercise at least 150 minutes every week.

## 2023-07-03 NOTE — Assessment & Plan Note (Signed)
 Initially elevated but much better on recheck.  This is likely related to his anxiety.  Will continue his amlodipine 5 mg daily.  He will continue to monitor at home and let us know in a few weeks how he is doing.  Anticipate this will improve significantly as his body improves.

## 2023-07-15 ENCOUNTER — Other Ambulatory Visit: Payer: Self-pay | Admitting: Family Medicine

## 2023-07-30 ENCOUNTER — Other Ambulatory Visit: Payer: Self-pay | Admitting: Family Medicine

## 2023-07-31 MED ORDER — LISDEXAMFETAMINE DIMESYLATE 20 MG PO CAPS: 20.0000 mg | ORAL_CAPSULE | Freq: Every day | ORAL | 0 refills | Status: DC

## 2023-09-01 ENCOUNTER — Other Ambulatory Visit: Payer: Self-pay | Admitting: Family Medicine

## 2023-09-01 NOTE — Telephone Encounter (Signed)
 Copied from CRM [id number]. Topic: Clinical - Medication Refill >> Sep 01, 2023 10:43 AM Adonis Hoot wrote: Medication:  lisdexamfetamine (VYVANSE ) 20 MG capsule   Has the patient contacted their pharmacy? No (Agent: If no, request that the patient contact the pharmacy for the refill. If patient does not wish to contact the pharmacy document the reason why and proceed with request.) (Agent: If yes, when and what did the pharmacy advise?)  This is the patient's preferred pharmacy:  [facility] #[id number] Jonette Nestle, [place] - [address] AT [facility] [address] [place] [address] Phone: [phone] Fax: [phone]  Is this the correct pharmacy for this prescription? Yes If no, delete pharmacy and type the correct one.   Has the prescription been filled recently? No  Is the patient out of the medication? No(4 pills)  Has the patient been seen for an appointment in the last year OR does the patient have an upcoming appointment? Yes  Can we respond through MyChart? Yes  Agent: Please be advised that Rx refills may take up to 3 business days. We ask that you follow-up with your pharmacy.

## 2023-09-01 NOTE — Telephone Encounter (Signed)
 Pt requesting refill for Vyvanse  20 mg capsule. Last OV 07/03/2023, up coming appt 10/03/2023.

## 2023-09-01 NOTE — Telephone Encounter (Signed)
 Last Fill: 07/31/23  Last OV: 07/03/23  Next OV: 10/03/23  Routing to provider for review/authorization.

## 2023-09-02 MED ORDER — LISDEXAMFETAMINE DIMESYLATE 20 MG PO CAPS: 20.0000 mg | ORAL_CAPSULE | Freq: Every day | ORAL | 0 refills | Status: DC

## 2023-10-01 ENCOUNTER — Other Ambulatory Visit: Payer: Self-pay | Admitting: Family Medicine

## 2023-10-01 NOTE — Telephone Encounter (Signed)
 Copied from CRM [id number]. Topic: Clinical - Medication Refill >> Oct 01, 2023  2:36 PM Freya Jesus wrote: Medication: lisdexamfetamine (VYVANSE ) 20 MG capsule [[id number]]  Has the patient contacted their pharmacy? Yes (Agent: If no, request that the patient contact the pharmacy for the refill. If patient does not wish to contact the pharmacy document the reason why and proceed with request.) (Agent: If yes, when and what did the pharmacy advise?) call provider   This is the patient's preferred pharmacy:  [facility] #[id number] Jonette Nestle, [place] - [address] AT [facility] [address] [place] [address] Phone: [phone] Fax: [phone]  Is this the correct pharmacy for this prescription? Yes If no, delete pharmacy and type the correct one.   Has the prescription been filled recently? Yes  Is the patient out of the medication? No, 3 days left  Has the patient been seen for an appointment in the last year OR does the patient have an upcoming appointment? Yes  Can we respond through MyChart? Yes  Agent: Please be advised that Rx refills may take up to 3 business days. We ask that you follow-up with your pharmacy.

## 2023-10-02 MED ORDER — LISDEXAMFETAMINE DIMESYLATE 20 MG PO CAPS: 20.0000 mg | ORAL_CAPSULE | Freq: Every day | ORAL | 0 refills | Status: DC

## 2023-10-03 ENCOUNTER — Ambulatory Visit: Admitting: Family Medicine

## 2023-10-14 ENCOUNTER — Ambulatory Visit: Admitting: Family Medicine

## 2023-10-16 ENCOUNTER — Telehealth: Payer: Self-pay | Admitting: Family Medicine

## 2023-10-16 NOTE — Telephone Encounter (Signed)
 Copied from CRM [id number]. Topic: Clinical - Medication Refill >> Oct 16, 2023  4:38 PM [place] J wrote: Medication: sertraline  (ZOLOFT ) 50 MG tablet  Has the patient contacted their pharmacy? Yes (Agent: If no, request that the patient contact the pharmacy for the refill. If patient does not wish to contact the pharmacy document the reason why and proceed with request.) (Agent: If yes, when and what did the pharmacy advise?) Pharmacy did not have remaining refills for patient.  This is the patient's preferred pharmacy:  [facility] #[id number] [address] M[address]ITA, [place] - [address] AT [facility] OF [address] [place] [address] Phone: [phone] Fax: [phone]  Is this the correct pharmacy for this prescription? Yes If no, delete pharmacy and type the correct one.   Has the prescription been filled recently? No  Is the patient out of the medication? No  Has the patient been seen for an appointment in the last year OR does the patient have an upcoming appointment? Yes  Can we respond through MyChart? Yes  Agent: Please be advised that Rx refills may take up to 3 business days. We ask that you follow-up with your pharmacy.

## 2023-10-20 MED ORDER — SERTRALINE HCL 50 MG PO TABS: 50.0000 mg | ORAL_TABLET | Freq: Every day | ORAL | 3 refills | Status: DC

## 2023-10-21 ENCOUNTER — Ambulatory Visit: Admitting: Family Medicine

## 2023-10-28 ENCOUNTER — Ambulatory Visit: Admitting: Family Medicine

## 2023-11-03 ENCOUNTER — Other Ambulatory Visit: Payer: Self-pay | Admitting: Family Medicine

## 2023-11-03 NOTE — Telephone Encounter (Signed)
 Copied from CRM [id number]. Topic: Clinical - Medication Refill >> Nov 03, 2023  5:54 PM Taleah C wrote: Medication: vyvanse   This is the patient's preferred pharmacy:  [facility] #[id number] [address], [place] - [address] AT [facility] [address] [place] [address] Phone: [phone] Fax: [phone]  Is this the correct pharmacy for this prescription? Yes If no, delete pharmacy and type the correct one.   Has the prescription been filled recently? Yes  Is the patient out of the medication? Yes  Has the patient been seen for an appointment in the last year OR does the patient have an upcoming appointment? Yes  Can we respond through MyChart? Yes  Agent: Please be advised that Rx refills may take up to 3 business days. We ask that you follow-up with your pharmacy.

## 2023-11-04 ENCOUNTER — Ambulatory Visit (INDEPENDENT_AMBULATORY_CARE_PROVIDER_SITE_OTHER): Admitting: Family Medicine

## 2023-11-04 VITALS — BP 151/90 | HR 83 | Temp 98.2°F | Ht 73.0 in | Wt 206.8 lb

## 2023-11-04 DIAGNOSIS — I10 Essential (primary) hypertension: Secondary | ICD-10-CM | POA: Diagnosis not present

## 2023-11-04 DIAGNOSIS — F988 Other specified behavioral and emotional disorders with onset usually occurring in childhood and adolescence: Secondary | ICD-10-CM

## 2023-11-04 DIAGNOSIS — F411 Generalized anxiety disorder: Secondary | ICD-10-CM

## 2023-11-04 MED ORDER — LISDEXAMFETAMINE DIMESYLATE 20 MG PO CAPS: 20.0000 mg | ORAL_CAPSULE | Freq: Every day | ORAL | 0 refills | Status: DC

## 2023-11-04 NOTE — Progress Notes (Signed)
   Don Howell is a 33 y.o. male who presents today for an office visit.  Assessment/Plan:  Chronic Problems Addressed Today: Generalized anxiety disorder He is doing very well with Zoloft  50 mg daily.  Feels like this is a good dose.  No significant side effects.  Will continue current dose for now.  Follow-up in 6 months.  ADD (attention deficit disorder) without hyperactivity On Vyvanse  20 mg daily.  Tolerating well.  Database without red flags.  Refilled earlier today.  No significant side effects.  Medications help with ability to stay focused on task.  Follow-up in 3 to 6 months at CPE.  Essential hypertension Elevated today though was at goal last week and has been well-controlled at home as well.  Will continue current regimen amlodipine  5 mg daily.  He will monitor at home and let us  know if persistently elevated.  Likely has some mild but could hypertension as well.     Subjective:  HPI:  See A/P for status of chronic conditions.  Patient is here today for 54-month follow-up.  At our last visit we continued Zoloft  50 mg daily for his anxiety.  We also continued Vyvanse  20 mg daily for ADHD.  He is doing well today.        Objective:  Physical Exam: BP (!) 151/90   Pulse 83   Temp 98.2 F (36.8 C) (Temporal)   Ht 6' 1 (1.854 m)   Wt 206 lb 12.8 oz (93.8 kg)   SpO2 98%   BMI 27.28 kg/m   Gen: No acute distress, resting comfortably CV: Regular rate and rhythm with no murmurs appreciated Pulm: Normal work of breathing, clear to auscultation bilaterally with no crackles, wheezes, or rhonchi Neuro: Grossly normal, moves all extremities Psych: Normal affect and thought content      Don Aday M. Kennyth, MD 11/04/2023 1:54 PM

## 2023-11-04 NOTE — Assessment & Plan Note (Signed)
 On Vyvanse  20 mg daily.  Tolerating well.  Database without red flags.  Refilled earlier today.  No significant side effects.  Medications help with ability to stay focused on task.  Follow-up in 3 to 6 months at CPE.

## 2023-11-04 NOTE — Assessment & Plan Note (Addendum)
 Elevated today though was at goal last week and has been well-controlled at home as well.  Will continue current regimen amlodipine  5 mg daily.  He will monitor at home and let us  know if persistently elevated.  Likely has some mild but could hypertension as well.

## 2023-11-04 NOTE — Patient Instructions (Signed)
 It was very nice to see you today!  I am glad that you are doing well today.  No medication changes.  Please continue work on diet and exercise.  Monitor your blood pressure at home and let us  know if persistently elevated.  Return in about 6 months (around 05/06/2024) for Annual Physical.   Take care, Dr Kennyth  PLEASE NOTE:  If you had any lab tests, please let us  know if you have not heard back within a few days. You may see your results on mychart before we have a chance to review them but we will give you a call once they are reviewed by us .   If we ordered any referrals today, please let us  know if you have not heard from their office within the next week.   If you had any urgent prescriptions sent in today, please check with the pharmacy within an hour of our visit to make sure the prescription was transmitted appropriately.   Please try these tips to maintain a healthy lifestyle:  Eat at least 3 REAL meals and 1-2 snacks per day.  Aim for no more than 5 hours between eating.  If you eat breakfast, please do so within one hour of getting up.   Each meal should contain half fruits/vegetables, one quarter protein, and one quarter carbs (no bigger than a computer mouse)  Cut down on sweet beverages. This includes juice, soda, and sweet tea.   Drink at least 1 glass of water with each meal and aim for at least 8 glasses per day  Exercise at least 150 minutes every week.

## 2023-11-04 NOTE — Assessment & Plan Note (Signed)
 He is doing very well with Zoloft  50 mg daily.  Feels like this is a good dose.  No significant side effects.  Will continue current dose for now.  Follow-up in 6 months.

## 2023-11-21 ENCOUNTER — Telehealth: Admitting: Nurse Practitioner

## 2023-11-21 DIAGNOSIS — J069 Acute upper respiratory infection, unspecified: Secondary | ICD-10-CM | POA: Diagnosis not present

## 2023-11-21 MED ORDER — IPRATROPIUM BROMIDE 0.03 % NA SOLN: 2.0000 | Freq: Two times a day (BID) | NASAL | 12 refills | Status: AC

## 2023-11-21 MED ORDER — BENZONATATE 100 MG PO CAPS: 100.0000 mg | ORAL_CAPSULE | Freq: Three times a day (TID) | ORAL | 0 refills | Status: DC | PRN

## 2023-11-21 NOTE — Progress Notes (Signed)

## 2023-12-08 ENCOUNTER — Other Ambulatory Visit: Payer: Self-pay | Admitting: Family Medicine

## 2023-12-08 NOTE — Telephone Encounter (Signed)
 Pt requesting refill for Vyvanse  20 mg. Last OV 10/2023.

## 2023-12-08 NOTE — Telephone Encounter (Signed)
 Copied from CRM [id number]. Topic: Clinical - Medication Refill >> Dec 08, 2023 10:30 AM Ismael A wrote: Medication: lisdexamfetamine (VYVANSE ) 20 MG capsule  Has the patient contacted their pharmacy? Yes (Agent: If no, request that the patient contact the pharmacy for the refill. If patient does not wish to contact the pharmacy document the reason why and proceed with request.) (Agent: If yes, when and what did the pharmacy advise?)  This is the patient's preferred pharmacy:  [facility] #[id number] [address], [place] - [address] AT [facility] OF [address] [place] [address] Phone: [phone] Fax: [phone]  Is this the correct pharmacy for this prescription? Yes If no, delete pharmacy and type the correct one.   Has the prescription been filled recently? No  Is the patient out of the medication? Yes  Has the patient been seen for an appointment in the last year OR does the patient have an upcoming appointment? Yes  Can we respond through MyChart? Yes  Agent: Please be advised that Rx refills may take up to 3 business days. We ask that you follow-up with your pharmacy.

## 2023-12-08 NOTE — Telephone Encounter (Signed)
 Duplicate

## 2023-12-12 ENCOUNTER — Other Ambulatory Visit: Payer: Self-pay | Admitting: Family Medicine

## 2023-12-12 NOTE — Telephone Encounter (Signed)
 Copied from CRM [id number]. Topic: Clinical - Prescription Issue >> Dec 12, 2023  1:24 PM Don Howell wrote: Reason for CRM: Patient calling because his refill request was placed 8/25 and it's been more than 3 days.

## 2023-12-16 ENCOUNTER — Other Ambulatory Visit: Payer: Self-pay | Admitting: Family Medicine

## 2023-12-16 MED ORDER — LISDEXAMFETAMINE DIMESYLATE 20 MG PO CAPS: 20.0000 mg | ORAL_CAPSULE | Freq: Every day | ORAL | 0 refills | Status: DC

## 2024-01-15 ENCOUNTER — Other Ambulatory Visit: Payer: Self-pay | Admitting: Family Medicine

## 2024-01-15 NOTE — Telephone Encounter (Unsigned)
 Copied from CRM [id number]. Topic: Clinical - Medication Refill >> Jan 15, 2024  2:44 PM Drema MATSU wrote: Medication: lisdexamfetamine (VYVANSE ) 20 MG capsule  Has the patient contacted their pharmacy? Yes (Agent: If no, request that the patient contact the pharmacy for the refill. If patient does not wish to contact the pharmacy document the reason why and proceed with request.) needed a new prescription  (Agent: If yes, when and what did the pharmacy advise?)  This is the patient's preferred pharmacy:  [facility] #[id number] [address], [place] - [address] AT [facility] [address] [place] [address] Phone: [phone] Fax: [phone]  Is this the correct pharmacy for this prescription? Yes If no, delete pharmacy and type the correct one.   Has the prescription been filled recently? Yes  Is the patient out of the medication? No  Has the patient been seen for an appointment in the last year OR does the patient have an upcoming appointment? Yes  Can we respond through MyChart? Yes  Agent: Please be advised that Rx refills may take up to 3 business days. We ask that you follow-up with your pharmacy.

## 2024-01-16 MED ORDER — LISDEXAMFETAMINE DIMESYLATE 20 MG PO CAPS: 20.0000 mg | ORAL_CAPSULE | Freq: Every day | ORAL | 0 refills | Status: DC

## 2024-02-13 ENCOUNTER — Other Ambulatory Visit: Payer: Self-pay | Admitting: Family Medicine

## 2024-02-13 MED ORDER — LISDEXAMFETAMINE DIMESYLATE 20 MG PO CAPS: 20.0000 mg | ORAL_CAPSULE | Freq: Every day | ORAL | 0 refills | Status: DC

## 2024-02-13 NOTE — Telephone Encounter (Signed)
 Copied from CRM [id number]. Topic: Clinical - Medication Refill >> Feb 13, 2024 11:53 AM Burnard DEL wrote: Medication: lisdexamfetamine (VYVANSE ) 20 MG capsule  Has the patient contacted their pharmacy? No (Agent: If no, request that the patient contact the pharmacy for the refill. If patient does not wish to contact the pharmacy document the reason why and proceed with request.) (Agent: If yes, when and what did the pharmacy advise?)  This is the patient's preferred pharmacy:  [facility] #[id number] [address], [place] - [address] AT [facility] OF [address] [place] [address] Phone: [phone] Fax: [phone]  Is this the correct pharmacy for this prescription? Yes If no, delete pharmacy and type the correct one.   Has the prescription been filled recently? No  Is the patient out of the medication? Yes  Has the patient been seen for an appointment in the last year OR does the patient have an upcoming appointment? Yes  Can we respond through MyChart? Yes  Agent: Please be advised that Rx refills may take up to 3 business days. We ask that you follow-up with your pharmacy.

## 2024-02-13 NOTE — Telephone Encounter (Signed)
 11/04/2023 LOV  01/16/2024 fil date  30/0 refills

## 2024-02-16 ENCOUNTER — Telehealth: Payer: Self-pay

## 2024-02-16 NOTE — Telephone Encounter (Signed)
 Copied from CRM [id number]. Topic: Clinical - Prescription Issue >> Feb 13, 2024  5:12 PM Shereese L wrote: Reason for CRM: patient called in and stated that they didn't get the diagnostic code for lisdexamfetamine (VYVANSE ) 20 MG capsule and needs that info so they can fill the script

## 2024-02-26 ENCOUNTER — Other Ambulatory Visit: Payer: Self-pay | Admitting: Family Medicine

## 2024-03-18 ENCOUNTER — Other Ambulatory Visit: Payer: Self-pay | Admitting: Family Medicine

## 2024-03-18 NOTE — Telephone Encounter (Signed)
 Copied from CRM [id number]. Topic: Clinical - Medication Refill >> Mar 18, 2024  2:21 PM Roselie C wrote: Medication: lisdexamfetamine (VYVANSE ) 20 MG capsule  Has the patient contacted their pharmacy? Yes (Agent: If no, request that the patient contact the pharmacy for the refill. If patient does not wish to contact the pharmacy document the reason why and proceed with request.) (Agent: If yes, when and what did the pharmacy advise?)  This is the patient's preferred pharmacy:  [facility]ORE #[id number] [address], [place] - [address] AT [facility] OF [address] [place] [address] Phone: [phone] Fax: [phone]  Is this the correct pharmacy for this prescription? Yes If no, delete pharmacy and type the correct one.   Has the prescription been filled recently? Yes  Is the patient out of the medication? No 2 doses left  Has the patient been seen for an appointment in the last year OR does the patient have an upcoming appointment? Yes  Can we respond through MyChart? Yes  Agent: Please be advised that Rx refills may take up to 3 business days. We ask that you follow-up with your pharmacy.

## 2024-03-19 MED ORDER — LISDEXAMFETAMINE DIMESYLATE 20 MG PO CAPS: 20.0000 mg | ORAL_CAPSULE | Freq: Every day | ORAL | 0 refills | Status: DC

## 2024-04-16 ENCOUNTER — Telehealth: Admitting: Emergency Medicine

## 2024-04-16 DIAGNOSIS — J069 Acute upper respiratory infection, unspecified: Secondary | ICD-10-CM

## 2024-04-16 MED ORDER — BENZONATATE 100 MG PO CAPS: 100.0000 mg | ORAL_CAPSULE | Freq: Two times a day (BID) | ORAL | 0 refills | Status: AC | PRN

## 2024-04-16 MED ORDER — FLUTICASONE PROPIONATE 50 MCG/ACT NA SUSP: 2.0000 | Freq: Every day | NASAL | 0 refills | Status: AC

## 2024-04-16 NOTE — Progress Notes (Signed)

## 2024-04-22 ENCOUNTER — Other Ambulatory Visit: Payer: Self-pay | Admitting: Family Medicine

## 2024-04-22 NOTE — Telephone Encounter (Signed)
 11/04/2023 LOV  03/19/2024 fill date  30/0 refills

## 2024-04-22 NOTE — Telephone Encounter (Signed)
 Copied from CRM [id number]. Topic: Clinical - Medication Refill >> Apr 22, 2024  3:41 PM Drema MATSU wrote: Medication: lisdexamfetamine  (VYVANSE ) 20 MG capsule  Has the patient contacted their pharmacy? Yes (Agent: If no, request that the patient contact the pharmacy for the refill. If patient does not wish to contact the pharmacy document the reason why and proceed with request.) Call provider (Agent: If yes, when and what did the pharmacy advise?)  This is the patient's preferred pharmacy:  [facility] #[id number] [address], [place] - [address] AT [facility] [address] [place] [address] Phone: [phone] Fax: [phone]  Is this the correct pharmacy for this prescription? Yes If no, delete pharmacy and type the correct one.   Has the prescription been filled recently? Yes  Is the patient out of the medication? No 4 doses left  Has the patient been seen for an appointment in the last year OR does the patient have an upcoming appointment? Yes  Can we respond through MyChart? Yes  Agent: Please be advised that Rx refills may take up to 3 business days. We ask that you follow-up with your pharmacy.

## 2024-04-23 MED ORDER — LISDEXAMFETAMINE DIMESYLATE 20 MG PO CAPS: 20.0000 mg | ORAL_CAPSULE | Freq: Every day | ORAL | 0 refills | Status: AC

## 2024-06-01 ENCOUNTER — Encounter: Admitting: Family Medicine
